# Patient Record
Sex: Male | Born: 1975 | Race: White | Hispanic: No | Marital: Married | Smoking: Former smoker
Health system: Southern US, Community
[De-identification: ages and names within clinical notes are randomized; demographics above are authoritative.]

## PROBLEM LIST (undated history)

## (undated) DIAGNOSIS — R609 Edema, unspecified: Secondary | ICD-10-CM

## (undated) DIAGNOSIS — K5792 Diverticulitis of intestine, part unspecified, without perforation or abscess without bleeding: Secondary | ICD-10-CM

## (undated) DIAGNOSIS — Z800 Family history of malignant neoplasm of digestive organs: Secondary | ICD-10-CM

## (undated) DIAGNOSIS — R7303 Prediabetes: Secondary | ICD-10-CM

## (undated) DIAGNOSIS — R600 Localized edema: Secondary | ICD-10-CM

## (undated) DIAGNOSIS — G4733 Obstructive sleep apnea (adult) (pediatric): Secondary | ICD-10-CM

## (undated) DIAGNOSIS — Z720 Tobacco use: Secondary | ICD-10-CM

## (undated) DIAGNOSIS — K219 Gastro-esophageal reflux disease without esophagitis: Secondary | ICD-10-CM

## (undated) DIAGNOSIS — I10 Essential (primary) hypertension: Secondary | ICD-10-CM

## (undated) HISTORY — DX: Prediabetes: R73.03

## (undated) HISTORY — DX: Edema, unspecified: R60.9

## (undated) HISTORY — DX: Localized edema: R60.0

## (undated) HISTORY — DX: Family history of malignant neoplasm of digestive organs: Z80.0

## (undated) HISTORY — DX: Diverticulitis of intestine, part unspecified, without perforation or abscess without bleeding: K57.92

## (undated) HISTORY — DX: Morbid (severe) obesity due to excess calories: E66.01

## (undated) HISTORY — PX: COLONOSCOPY: SHX174

## (undated) HISTORY — DX: Gastro-esophageal reflux disease without esophagitis: K21.9

## (undated) HISTORY — DX: Essential (primary) hypertension: I10

## (undated) HISTORY — DX: Tobacco use: Z72.0

---
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
## 2015-08-20 DIAGNOSIS — K5792 Diverticulitis of intestine, part unspecified, without perforation or abscess without bleeding: Secondary | ICD-10-CM

## 2015-08-20 HISTORY — DX: Diverticulitis of intestine, part unspecified, without perforation or abscess without bleeding: K57.92

## 2015-08-20 HISTORY — PX: OTHER SURGICAL HISTORY: SHX169

## 2018-09-16 ENCOUNTER — Encounter: Payer: Self-pay | Admitting: Family Medicine

## 2018-09-16 ENCOUNTER — Ambulatory Visit: Payer: Self-pay | Admitting: Family Medicine

## 2018-09-16 MED ORDER — AMOXICILLIN 875 MG PO TABS: 875.0000 mg | ORAL_TABLET | Freq: Two times a day (BID) | ORAL | 0 refills | Status: AC

## 2018-09-16 MED ORDER — PREDNISONE 20 MG PO TABS: ORAL_TABLET | ORAL | 0 refills | Status: DC

## 2018-09-16 NOTE — Patient Instructions (Signed)
Get otc generic robitussin DM OR Mucinex DM and use as directed on the packaging for cough and congestion. Use otc generic saline nasal spray 2-3 times per day to irrigate/moisturize your nasal passages.   

## 2018-09-16 NOTE — Progress Notes (Signed)
Office Note 10/19/2018  CC:  Chief Complaint  Patient presents with  . Establish Care    Previous PCP: [facility] in [place], [place]  . URI   HPI:  Hector Peters is a 43 y.o.  male who is here to establish care and discuss respiratory symptoms. Patient's most recent primary MD: see above. Old records were not available for review prior to or during today's visit.  Most recent CPE with labs was approx 2018.   He relocated here from [place] in 2018 and got a cpe just prior to coming here.  Onset 7 d/a nasal congestion, cough, feels like he is wheezing.  No HA or ST. Hoarse voice.  Dayquil/nyquil.  Sleep has been poor.  Some facial pain and upper teeth pain diffusely. No fevers.  Past Medical History:  Diagnosis Date  . Diverticulitis 2017   microperforation  . Family history of colon cancer in father   . GERD (gastroesophageal reflux disease)   . Peripheral edema    chlorthalidone for ankle swelling (per past PCP in [place]).    Past Surgical History:  Procedure Laterality Date  . COLONOSCOPY  2015; 2018   2015 screening due to +FH -->normal.  2018 Normal (done after divertic with microperf).   Recall 2021.  . microperforation  2017   Diverticulitis    Family History  Problem Relation Age of Onset  . Hypertension Mother   . Alcohol abuse Father   . Arthritis Father   . Cancer Father   . Colon cancer Father   . Testicular cancer Father   . Hypertension Father   . Hypertension Sister   . COPD Maternal Grandmother   . Heart disease Maternal Grandmother   . Heart attack Maternal Grandfather   . Heart disease Paternal Grandmother   . Lung cancer Paternal Grandfather     Social History   Socioeconomic History  . Marital status: Married    Spouse name: Not on file  . Number of children: Not on file  . Years of education: Not on file  . Highest education level: Not on file  Occupational History  . Not on file  Social Needs  . Financial resource strain:  Not on file  . Food insecurity:    Worry: Not on file    Inability: Not on file  . Transportation needs:    Medical: Not on file    Non-medical: Not on file  Tobacco Use  . Smoking status: Current Every Day Smoker    Packs/day: 1.00    Years: 20.00    Pack years: 20.00    Types: Cigarettes  . Smokeless tobacco: Never Used  Substance and Sexual Activity  . Alcohol use: Yes    Comment: occasionally  . Drug use: Never  . Sexual activity: Not on file  Lifestyle  . Physical activity:    Days per week: Not on file    Minutes per session: Not on file  . Stress: Not on file  Relationships  . Social connections:    Talks on phone: Not on file    Gets together: Not on file    Attends religious service: Not on file    Active member of club or organization: Not on file    Attends meetings of clubs or organizations: Not on file    Relationship status: Not on file  . Intimate partner violence:    Fear of current or ex partner: Not on file    Emotionally abused: Not on  file    Physically abused: Not on file    Forced sexual activity: Not on file  Other Topics Concern  . 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   Tobacco:20 pack-yr hx, active as of 09/16/2018.   Alc: social drinker.   No drugs.    Outpatient Encounter Medications as of 09/16/2018  Medication Sig  . chlorthalidone (HYGROTON) 25 MG tablet Take 25 mg by mouth daily.  . [EXPIRED] amoxicillin (AMOXIL) 875 MG tablet Take 1 tablet (875 mg total) by mouth 2 (two) times daily for 7 days.  . predniSONE (DELTASONE) 20 MG tablet 2 tabs po qd x 5d, then 1 tab po qd x 5d   No facility-administered encounter medications on file as of 09/16/2018.     No Known Allergies  ROS Review of Systems  Constitutional: Negative for fatigue and fever.  HENT: Positive for rhinorrhea, sinus pressure and sinus pain. Negative for  congestion and sore throat.   Eyes: Negative for visual disturbance.  Respiratory: Positive for cough and wheezing.   Cardiovascular: Negative for chest pain.  Gastrointestinal: Negative for abdominal pain and nausea.  Genitourinary: Negative for dysuria.  Musculoskeletal: Negative for back pain and joint swelling.  Skin: Negative for rash.  Neurological: Negative for weakness and headaches.  Hematological: Negative for adenopathy.  Psychiatric/Behavioral: Positive for sleep disturbance (wife says apneic spells chronically).    PE; Blood pressure 137/84, pulse 77, temperature (!) 97.5 F (36.4 C), temperature source Oral, resp. rate 16, height 5' 7.5" (1.715 m), weight 286 lb 2 oz (129.8 kg), SpO2 93 %. Body mass index is 44.15 kg/m.  VS: noted--normal except bp a little high. Gen: alert, NAD, NONTOXIC APPEARING. HEENT: eyes without injection, drainage, or swelling.  Ears: EACs clear, TMs with normal light reflex and landmarks.  Nose: Clear rhinorrhea, with some dried, crusty exudate adherent to mildly injected mucosa.  No purulent d/c.  No paranasal sinus TTP.  No facial swelling.  Throat and mouth without focal lesion.  No pharyngial swelling, erythema, or exudate.   Neck: supple, no LAD.   LUNGS: CTA bilat on inspiration, with some mild diffuse wheezing on exhalation.  Aeration is good, exp phase is not prolonged, nonlabored resps.   CV: RRR, no m/r/g. EXT: no c/c/e SKIN: no rash   Pertinent labs:   None today.  ASSESSMENT AND PLAN:   New pt; obtain prior PCP records.  1) Acute bronchitis with acute sinusitis: prednisone 40mg  qd x 5d, then 20mg  qd x 5d. Amoxil 875mg  bid x 7d.  2) HTN: The current medical regimen is effective;  continue present plan and medications.  When returns in 1 mo for CPE he will need referral for eval for OSA.  An After Visit Summary was printed and given to the patient.  Return in about 4 weeks (around 10/14/2018) for annual CPE  (fasting).  Signed:  Santiago Bumpers, MD           10/19/2018

## 2018-10-18 DIAGNOSIS — R7303 Prediabetes: Secondary | ICD-10-CM

## 2018-10-18 HISTORY — DX: Prediabetes: R73.03

## 2018-10-20 ENCOUNTER — Ambulatory Visit (INDEPENDENT_AMBULATORY_CARE_PROVIDER_SITE_OTHER): Payer: No Typology Code available for payment source | Admitting: Family Medicine

## 2018-10-20 ENCOUNTER — Encounter: Payer: Self-pay | Admitting: Family Medicine

## 2018-10-20 VITALS — BP 120/78 | HR 86 | Temp 97.7°F | Resp 16 | Ht 67.5 in | Wt 287.2 lb

## 2018-10-20 DIAGNOSIS — Z23 Encounter for immunization: Secondary | ICD-10-CM | POA: Diagnosis not present

## 2018-10-20 DIAGNOSIS — F17200 Nicotine dependence, unspecified, uncomplicated: Secondary | ICD-10-CM | POA: Diagnosis not present

## 2018-10-20 DIAGNOSIS — Z0000 Encounter for general adult medical examination without abnormal findings: Secondary | ICD-10-CM

## 2018-10-20 DIAGNOSIS — G4733 Obstructive sleep apnea (adult) (pediatric): Secondary | ICD-10-CM | POA: Diagnosis not present

## 2018-10-20 LAB — COMPREHENSIVE METABOLIC PANEL
ALT: 30 U/L (ref 0–53)
AST: 18 U/L (ref 0–37)
Albumin: 4.5 g/dL (ref 3.5–5.2)
Alkaline Phosphatase: 62 U/L (ref 39–117)
BUN: 19 mg/dL (ref 6–23)
CO2: 29 meq/L (ref 19–32)
Calcium: 9.7 mg/dL (ref 8.4–10.5)
Chloride: 101 mEq/L (ref 96–112)
Creatinine, Ser: 1 mg/dL (ref 0.40–1.50)
GFR: 81.8 mL/min (ref >60.00)
GLUCOSE: 114 mg/dL — AB (ref 70–99)
Potassium: 4.2 mEq/L (ref 3.5–5.1)
SODIUM: 138 meq/L (ref 135–145)
Total Bilirubin: 0.5 mg/dL (ref 0.2–1.2)
Total Protein: 6.9 g/dL (ref 6.0–8.3)

## 2018-10-20 LAB — CBC WITH DIFFERENTIAL/PLATELET
Basophils Absolute: 0.1 10*3/uL (ref 0.0–0.1)
Basophils Relative: 1.1 % (ref 0.0–3.0)
Eosinophils Absolute: 0.4 10*3/uL (ref 0.0–0.7)
Eosinophils Relative: 4.6 % (ref 0.0–5.0)
HCT: 42.9 % (ref 39.0–52.0)
Hemoglobin: 15.2 g/dL (ref 13.0–17.0)
LYMPHS ABS: 2.6 10*3/uL (ref 0.7–4.0)
Lymphocytes Relative: 32 % (ref 12.0–46.0)
MCHC: 35.5 g/dL (ref 30.0–36.0)
MCV: 95.9 fl (ref 78.0–100.0)
Monocytes Absolute: 0.5 10*3/uL (ref 0.1–1.0)
Monocytes Relative: 6.7 % (ref 3.0–12.0)
NEUTROS PCT: 55.6 % (ref 43.0–77.0)
Neutro Abs: 4.5 10*3/uL (ref 1.4–7.7)
PLATELETS: 333 10*3/uL (ref 150.0–400.0)
RBC: 4.47 Mil/uL (ref 4.22–5.81)
RDW: 14.2 % (ref 11.5–15.5)
WBC: 8 10*3/uL (ref 4.0–10.5)

## 2018-10-20 LAB — LIPID PANEL
CHOL/HDL RATIO: 5
Cholesterol: 169 mg/dL (ref 0–200)
HDL: 31.1 mg/dL — ABNORMAL LOW (ref >39.00)
LDL Cholesterol: 113 mg/dL — ABNORMAL HIGH (ref 0–99)
NonHDL: 137.98
Triglycerides: 125 mg/dL (ref 0.0–149.0)
VLDL: 25 mg/dL (ref 0.0–40.0)

## 2018-10-20 LAB — TSH: TSH: 1.27 u[IU]/mL (ref 0.35–4.50)

## 2018-10-20 NOTE — Progress Notes (Signed)
Office Note 10/20/2018  CC:  Chief Complaint  Patient presents with  . Annual Exam    pt is fasting   HPI:  Hector Peters is a 43 y.o. male who is here for annual health maintenance exam.  Still smoking, "I would love to quit".  Chantix no help in the past.   He is not interested in trying wellbutrin or nicotine replacement.  Exercise: just started getting back in gym. Diet: trying to eat 6 small meals per day, cutting back on carbs.   Has c/o snoring, wife states he goes long periods w/out breathing during sleep.  He often wakes up in the middle of sleep gasping-->"feels like my throat closes up for a minute".  Some morning HA's.  Denies excessive daytime somnolence, though.  +ED lately.  Past Medical History:  Diagnosis Date  . Diverticulitis 2017   microperforation  . Family history of colon cancer in father   . GERD (gastroesophageal reflux disease)   . Peripheral edema    chlorthalidone for ankle swelling (per past PCP in [place]).  . Tobacco abuse     Past Surgical History:  Procedure Laterality Date  . COLONOSCOPY  2015; 2018   2015 screening due to +FH -->normal.  2018 Normal (done after divertic with microperf).   Recall 2021.  . microperforation  2017   Diverticulitis    Family History  Problem Relation Age of Onset  . Hypertension Mother   . Alcohol abuse Father   . Arthritis Father   . Cancer Father   . Colon cancer Father   . Testicular cancer Father   . Hypertension Father   . Hypertension Sister   . COPD Maternal Grandmother   . Heart disease Maternal Grandmother   . Heart attack Maternal Grandfather   . Heart disease Paternal Grandmother   . Lung cancer Paternal Grandfather     Social History   Socioeconomic History  . Marital status: Married    Spouse name: Not on file  . Number of children: Not on file  . Years of education: Not on file  . Highest education level: Not on file  Occupational History  . Not on file  Social Needs  .  Financial resource strain: Not on file  . Food insecurity:    Worry: Not on file    Inability: Not on file  . Transportation needs:    Medical: Not on file    Non-medical: Not on file  Tobacco Use  . Smoking status: Current Every Day Smoker    Packs/day: 1.00    Years: 20.00    Pack years: 20.00    Types: Cigarettes  . Smokeless tobacco: Never Used  Substance and Sexual Activity  . Alcohol use: Yes    Comment: occasionally  . Drug use: Never  . Sexual activity: Not on file  Lifestyle  . Physical activity:    Days per week: Not on file    Minutes per session: Not on file  . Stress: Not on file  Relationships  . Social connections:    Talks on phone: Not on file    Gets together: Not on file    Attends religious service: Not on file    Active member of club or organization: Not on file    Attends meetings of clubs or organizations: Not on file    Relationship status: Not on file  . Intimate partner violence:    Fear of current or ex partner: Not on file  Emotionally abused: Not on file    Physically abused: Not on file    Forced sexual activity: Not on file  Other Topics Concern  . 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   Tobacco:20 pack-yr hx, active as of 09/16/2018.   Alc: social drinker.   No drugs.    Outpatient Medications Prior to Visit  Medication Sig Dispense Refill  . chlorthalidone (HYGROTON) 25 MG tablet Take 25 mg by mouth daily.    . Probiotic Product (PROBIOTIC DAILY) CAPS Take by mouth.    . predniSONE (DELTASONE) 20 MG tablet 2 tabs po qd x 5d, then 1 tab po qd x 5d (Patient not taking: Reported on 10/20/2018) 15 tablet 0   No facility-administered medications prior to visit.     No Known Allergies  ROS Review of Systems  Constitutional: Negative for appetite change, chills, fatigue and fever.  HENT: Negative for congestion, dental  problem, ear pain and sore throat.   Eyes: Negative for discharge, redness and visual disturbance.  Respiratory: Negative for cough, chest tightness, shortness of breath and wheezing.   Cardiovascular: Negative for chest pain, palpitations and leg swelling.  Gastrointestinal: Negative for abdominal pain, blood in stool, diarrhea, nausea and vomiting.  Genitourinary: Negative for difficulty urinating, dysuria, flank pain, frequency, hematuria and urgency.  Musculoskeletal: Negative for arthralgias, back pain, joint swelling, myalgias and neck stiffness.  Skin: Negative for pallor and rash.  Neurological: Negative for dizziness, speech difficulty, weakness and headaches.  Hematological: Negative for adenopathy. Does not bruise/bleed easily.  Psychiatric/Behavioral: Negative for confusion and sleep disturbance. The patient is not nervous/anxious.     PE; Blood pressure 120/78, pulse 86, temperature 97.7 F (36.5 C), temperature source Oral, resp. rate 16, height 5' 7.5" (1.715 m), weight 287 lb 3.2 oz (130.3 kg), SpO2 92 %. Body mass index is 44.32 kg/m.  Gen: Alert, well appearing, obese.  Patient is oriented to person, place, time, and situation. AFFECT: pleasant, lucid thought and speech. ENT: Ears: EACs clear, normal epithelium.  TMs with good light reflex and landmarks bilaterally.  Eyes: no injection, icteris, swelling, or exudate.  EOMI, PERRLA. Nose: no drainage or turbinate edema/swelling.  No injection or focal lesion.  Mouth: lips without lesion/swelling.  Oral mucosa pink and moist.  Dentition intact and without obvious caries or gingival swelling.  Oropharynx without erythema, exudate, or swelling. Fullness of soft palate, with narrow pharynx as a result. Neck: supple/nontender.  Fullness of neck soft tissues diffusely.  No LAD, mass, or TM.  Carotid pulses 2+ bilaterally, without bruits. CV: RRR, no m/r/g.   LUNGS: CTA bilat, nonlabored resps, good aeration in all lung  fields. ABD: soft, NT, ND, BS normal.  No hepatospenomegaly or mass.  No bruits. EXT: no clubbing, cyanosis, or edema.  Musculoskeletal: no joint swelling, erythema, warmth, or tenderness.  ROM of all joints intact. Skin - no sores or suspicious lesions or rashes or color changes   Pertinent labs:  NONE  ASSESSMENT AND PLAN:   1) OSA-->suspected.   Pt agreeable to referral to sleep MD for further evaluation.  2) Tobacco dependence: discussed quitting. He failed chantix. He doesn't want to try wellbutrin or nicotine replacement at this time-->wants to try quitting "cold [place]". Encouraged complete cessation.  3) Health maintenance exam: Reviewed age and gender appropriate health maintenance issues (prudent diet, regular exercise, health risks  of tobacco and excessive alcohol, use of seatbelts, fire alarms in home, use of sunscreen).  Also reviewed age and gender appropriate health screening as well as vaccine recommendations. Vaccines: Tdap-->given today.    Flu -->pt declined. Labs: fasting HP labs. Prostate ca screening: average risk patient= as per latest guidelines, start screening at 18 yrs of age. Colon ca screening: +FH colon ca in father-->next colonoscopy due 2021.  An After Visit Summary was printed and given to the patient.  FOLLOW UP:  Return in about 1 year (around 10/20/2019) for annual CPE (fasting).  Signed:  Santiago Bumpers, MD           10/20/2018

## 2018-10-20 NOTE — Addendum Note (Signed)
Addended by: Lenis Dickinson on: 10/20/2018 09:31 AM   Modules accepted: Orders

## 2018-10-20 NOTE — Patient Instructions (Signed)

## 2018-10-21 ENCOUNTER — Other Ambulatory Visit: Payer: Self-pay | Admitting: *Deleted

## 2018-10-21 ENCOUNTER — Encounter: Payer: Self-pay | Admitting: Family Medicine

## 2018-10-21 ENCOUNTER — Other Ambulatory Visit (INDEPENDENT_AMBULATORY_CARE_PROVIDER_SITE_OTHER): Payer: No Typology Code available for payment source

## 2018-10-21 ENCOUNTER — Other Ambulatory Visit: Payer: Self-pay | Admitting: Family Medicine

## 2018-10-21 DIAGNOSIS — R7301 Impaired fasting glucose: Secondary | ICD-10-CM

## 2018-10-21 DIAGNOSIS — R7303 Prediabetes: Secondary | ICD-10-CM

## 2018-10-21 LAB — HEMOGLOBIN A1C: Hgb A1c MFr Bld: 6.2 % (ref 4.6–6.5)

## 2018-10-23 ENCOUNTER — Ambulatory Visit: Payer: Self-pay | Admitting: Family Medicine

## 2018-10-23 NOTE — Telephone Encounter (Signed)
Pt called in and was given lab results from Dr. Milinda Cave dated 10/21/2018 at 5:01 PM.  He verbalized understanding of the diet instructions to lower his glucose and HbA1C.  No questions.  I scheduled him for a lab only appt for 03/25/2019 at 8:00.   I instructed him to come in fasting.

## 2019-02-12 ENCOUNTER — Other Ambulatory Visit: Payer: Self-pay

## 2019-02-12 ENCOUNTER — Ambulatory Visit: Payer: 59 | Admitting: Internal Medicine

## 2019-02-12 ENCOUNTER — Encounter: Payer: Self-pay | Admitting: Internal Medicine

## 2019-02-12 VITALS — BP 130/80 | HR 80 | Temp 97.9°F | Ht 67.0 in | Wt 288.6 lb

## 2019-02-12 DIAGNOSIS — G4733 Obstructive sleep apnea (adult) (pediatric): Secondary | ICD-10-CM | POA: Diagnosis not present

## 2019-02-12 DIAGNOSIS — Z720 Tobacco use: Secondary | ICD-10-CM | POA: Diagnosis not present

## 2019-02-12 DIAGNOSIS — R0683 Snoring: Secondary | ICD-10-CM | POA: Diagnosis not present

## 2019-02-12 NOTE — Progress Notes (Signed)
02/12/2019- 42 yoM current Smoker for sleep evaluation. referred by Dr. Milinda CaveMcGowen (PCP) for OSA evaluation, pt reports unrestfull sleep, snoring, and choking in the middle of the night. Daytime sleepiness. Body weight today 288 lbs Epworth score 4 No sleep med, 2-3 cups coffee daily. Denies ENT surgery, lung/ heart probs. Several family members with OSA on CPAP.   Prior to Admission medications   Medication Sig Start Date End Date Taking? Authorizing Provider  chlorthalidone (HYGROTON) 25 MG tablet Take 25 mg by mouth daily.   Yes [provider]  Probiotic Product (PROBIOTIC DAILY) CAPS Take by mouth.   Yes [provider]   Past Medical History:  Diagnosis Date  . Diverticulitis 2017   microperforation  . Family history of colon cancer in father   . GERD (gastroesophageal reflux disease)   . Peripheral edema    chlorthalidone for ankle swelling (per past PCP in [place]).  . Prediabetes 10/2018   fasting gluc 114, hbA1c 6.2%.  . Tobacco abuse    Past Surgical History:  Procedure Laterality Date  . COLONOSCOPY  2015; 2018   2015 screening due to +FH -->normal.  2018 Normal (done after divertic with microperf).   Recall 2021.  . microperforation  2017   Diverticulitis   Family History  Problem Relation Age of Onset  . Hypertension Mother   . Alcohol abuse Father   . Arthritis Father   . Cancer Father   . Colon cancer Father   . Testicular cancer Father   . Hypertension Father   . Hypertension Sister   . COPD Maternal Grandmother   . Heart disease Maternal Grandmother   . Heart attack Maternal Grandfather   . Heart disease Paternal Grandmother   . Lung cancer Paternal Grandfather    Social History   Socioeconomic History  . Marital status: Married    Spouse name: Not on file  . Number of children: Not on file  . Years of education: Not on file  . Highest education level: Not on file  Occupational History  . Not on file  Social Needs  . Financial  resource strain: Not on file  . Food insecurity    Worry: Not on file    Inability: Not on file  . Transportation needs    Medical: Not on file    Non-medical: Not on file  Tobacco Use  . Smoking status: Current Every Day Smoker    Packs/day: 1.00    Years: 20.00    Pack years: 20.00    Types: Cigarettes  . Smokeless tobacco: Never Used  Substance and Sexual Activity  . Alcohol use: Yes    Comment: occasionally  . Drug use: Never  . Sexual activity: Not on file  Lifestyle  . Physical activity    Days per week: Not on file    Minutes per session: Not on file  . Stress: Not on file  Relationships  . Social Musicianconnections    Talks on phone: Not on file    Gets together: Not on file    Attends religious service: Not on file    Active member of club or organization: Not on file    Attends meetings of clubs or organizations: Not on file    Relationship status: Not on file  . Intimate partner violence    Fear of current or ex partner: Not on file    Emotionally abused: Not on file    Physically abused: Not on file    Forced  sexual activity: Not on file  Other Topics Concern  . 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   Tobacco:20 pack-yr hx, active as of 09/16/2018.   Alc: social drinker.   No drugs.   ROS-see HPI  + = positive Constitutional:    weight loss, night sweats, fevers, chills, fatigue, lassitude. HEENT:    headaches, difficulty swallowing, +tooth/dental problems, sore throat,       sneezing, itching, ear ache, nasal congestion, post nasal drip, snoring CV:    chest pain, orthopnea, PND, swelling in lower extremities, anasarca,                                  dizziness, palpitations Resp:   +shortness of breath with exertion or at rest.                productive cough,   non-productive cough, coughing up of blood.              change in color of mucus.   wheezing.   Skin:    rash or lesions. GI:  + heartburn, indigestion, abdominal pain, nausea, vomiting, diarrhea,                 change in bowel habits, loss of appetite GU: dysuria, change in color of urine, no urgency or frequency.   flank pain. MS:   joint pain, stiffness, decreased range of motion, back pain. Neuro-     nothing unusual Psych:  change in mood or affect.  depression or anxiety.   memory loss.  OBJ- Physical Exam General- Alert, Oriented, Affect-appropriate, Distress- none acute, + obese Skin- rash-none, lesions- none, excoriation- none Lymphadenopathy- none Head- atraumatic            Eyes- Gross vision intact, PERRLA, conjunctivae and secretions clear            Ears- Hearing, canals-normal            Nose- Clear, no-Septal dev, mucus, polyps, erosion, perforation             Throat- Mallampati III , mucosa clear , drainage- none, tonsils- atrophic, + teeth Neck- flexible , trachea midline, no stridor , thyroid nl, carotid no bruit Chest - symmetrical excursion , unlabored           Heart/CV- RRR , no murmur , no gallop  , no rub, nl s1 s2                           - JVD- none , edema- none, stasis changes- none, varices- none           Lung- clear to P&A, wheeze- none, cough- none , dullness-none, rub- none           Chest wall-  Abd-  Br/ Gen/ Rectal- Not done, not indicated Extrem- cyanosis- none, clubbing, none, atrophy- none, strength- nl Neuro- grossly intact to observation

## 2019-02-12 NOTE — Assessment & Plan Note (Signed)
High probability for significant obstructive sleep apnea. Appropriate education and discussion about sleep hygiene, OSA, treatment, importance of weight and driving responsibility. Plan- sleep study, anticipate CPAP

## 2019-02-12 NOTE — Assessment & Plan Note (Signed)
Reports 1 ppd, denies lung symptoms now other than DOE which is nonspecific and may reflect his weight.  Plan- strongly encourage real effort to stop.

## 2019-02-12 NOTE — Patient Instructions (Signed)
Order- please schedule unattended home sleep test    Dx OSA  Please call me about 2 weeks after your sleep test to see if results and recommendations are ready yet. If appropriate, we may be able to start treatment before we see you next.

## 2019-02-17 DIAGNOSIS — G4733 Obstructive sleep apnea (adult) (pediatric): Secondary | ICD-10-CM

## 2019-02-17 HISTORY — PX: POLYSOMNOGRAPHY: SHX453

## 2019-02-17 HISTORY — DX: Obstructive sleep apnea (adult) (pediatric): G47.33

## 2019-03-01 ENCOUNTER — Telehealth: Payer: Self-pay | Admitting: Internal Medicine

## 2019-03-01 NOTE — Telephone Encounter (Signed)
Patient has been scheduled to pick up HST machine on 03/03/19 @ 9:00am and he is aware of the appt

## 2019-03-03 ENCOUNTER — Ambulatory Visit: Payer: 59

## 2019-03-03 ENCOUNTER — Other Ambulatory Visit: Payer: Self-pay

## 2019-03-03 DIAGNOSIS — G4733 Obstructive sleep apnea (adult) (pediatric): Secondary | ICD-10-CM | POA: Diagnosis not present

## 2019-03-08 DIAGNOSIS — G4733 Obstructive sleep apnea (adult) (pediatric): Secondary | ICD-10-CM | POA: Diagnosis not present

## 2019-03-23 ENCOUNTER — Encounter: Payer: Self-pay | Admitting: Family Medicine

## 2019-03-25 ENCOUNTER — Telehealth: Payer: Self-pay | Admitting: Internal Medicine

## 2019-03-25 ENCOUNTER — Ambulatory Visit: Payer: 59

## 2019-03-25 DIAGNOSIS — G4733 Obstructive sleep apnea (adult) (pediatric): Secondary | ICD-10-CM

## 2019-03-25 NOTE — Telephone Encounter (Signed)
CY, Mr. Hector Peters is requesting the results for his home sleep study performed on 03/03/2019. Please advise with your results and recommendations. Thank you.

## 2019-03-25 NOTE — Telephone Encounter (Signed)
His sleep study showed severe obstructive sleep apnea,averaging over 86 apneas/ hour, with drops in blood oxygen level.  I recommend we order new DME, new OSA, auto 5-20, mask of choice, humidifier, supplies, AirView/ card  Please make sure he has a return ov in 31-90 days per insurance regs

## 2019-03-25 NOTE — Telephone Encounter (Signed)
Called and spoke w/ pt regarding CY's results and recommendations. Pt verbalized understanding and agreed to these measures. Order for new DME, new CPAP start has been placed per CY. Nothing further needed at this time.

## 2019-03-29 ENCOUNTER — Telehealth: Payer: Self-pay | Admitting: Internal Medicine

## 2019-03-30 NOTE — Telephone Encounter (Signed)
Called and spoke with pt letting him know the info stated by [facility]. Pt verbalized understanding. I provided pt the phone number for [facility] so he could call them to get an update. Nothing further needed.

## 2019-03-30 NOTE — Telephone Encounter (Signed)
Order was placed on 03/25/2019 for CPAP.  [facility] - could you help follow up on this? Thanks.

## 2019-03-30 NOTE — Telephone Encounter (Signed)
This order was just sent last week on 03/25/19 it usually take 1-2 wks for the dem to call him he can call [facility] if he wants [phone] thanks

## 2019-04-01 ENCOUNTER — Ambulatory Visit (INDEPENDENT_AMBULATORY_CARE_PROVIDER_SITE_OTHER): Payer: 59

## 2019-04-01 ENCOUNTER — Other Ambulatory Visit: Payer: Self-pay

## 2019-04-01 DIAGNOSIS — R7303 Prediabetes: Secondary | ICD-10-CM

## 2019-04-01 LAB — HEMOGLOBIN A1C: Hgb A1c MFr Bld: 6.2 % (ref 4.6–6.5)

## 2019-04-01 LAB — GLUCOSE, RANDOM: Glucose, Bld: 111 mg/dL — ABNORMAL HIGH (ref 70–99)

## 2019-04-02 ENCOUNTER — Ambulatory Visit: Payer: 59 | Admitting: Podiatry

## 2019-04-02 ENCOUNTER — Encounter: Payer: Self-pay | Admitting: Family Medicine

## 2019-04-02 VITALS — BP 139/87 | HR 85 | Temp 98.2°F | Resp 16

## 2019-04-02 DIAGNOSIS — L600 Ingrowing nail: Secondary | ICD-10-CM | POA: Diagnosis not present

## 2019-04-02 MED ORDER — NEOMYCIN-POLYMYXIN-HC 3.5-10000-1 OT SOLN: OTIC | 1 refills | Status: DC

## 2019-04-02 NOTE — Progress Notes (Signed)
   Subjective:    Patient ID: Hector Peters, male    DOB: 1975-11-03, 43 y.o.   MRN: [id number]  HPI    Review of Systems  All other systems reviewed and are negative.      Objective:   Physical Exam        Assessment & Plan:

## 2019-04-02 NOTE — Progress Notes (Signed)
Subjective:   Patient ID: Hector Peters, male   DOB: 43 y.o.   MRN: [id number]   HPI Patient presents stating that he has had a painful ingrown toenail of his left big toe and has a long-term history of these.  States the recent episode is been for several weeks and he is tried to soak it in treatment without relief and states the right one bothers him but not to the same degree.  Patient smokes 1 pack/day and would like to be active and is obese   Review of Systems  All other systems reviewed and are negative.       Objective:  Physical Exam Vitals signs and nursing note reviewed.  Constitutional:      Appearance: He is well-developed.  Pulmonary:     Effort: Pulmonary effort is normal.  Musculoskeletal: Normal range of motion.  Skin:    General: Skin is warm.  Neurological:     Mental Status: He is alert.     Neurovascular status intact muscle strength found to be adequate range of motion within normal limits.  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  Explained procedure risk and patient wants surgery and signed consent form and today I infiltrated the left hallux 60 mg Xylocaine Marcaine mixture sterile prep applied to the toe using sterile instrumentation remove medial border exposed matrix and applied phenol 3 applications 30 seconds followed by alcohol lavage and sterile dressing.  Gave instructions on soaks and drops and leave dressing on 24 hours but take it off earlier if throbbing were to occur and encouraged patient to call with any questions concerns which may arise

## 2019-04-02 NOTE — Patient Instructions (Signed)

## 2019-06-11 ENCOUNTER — Ambulatory Visit: Payer: 59 | Admitting: Internal Medicine

## 2019-06-11 ENCOUNTER — Encounter: Payer: Self-pay | Admitting: Internal Medicine

## 2019-06-11 DIAGNOSIS — Z720 Tobacco use: Secondary | ICD-10-CM | POA: Diagnosis not present

## 2019-06-11 DIAGNOSIS — G4733 Obstructive sleep apnea (adult) (pediatric): Secondary | ICD-10-CM | POA: Diagnosis not present

## 2019-06-11 NOTE — Progress Notes (Signed)
HPI M smoker followed for OSA, complicated by morbid obesity, tobacco use, GERD,  HST 03/03/2019- AHI 86.4/ hr, desaturation to 76% w average 89%, body weight 288.9 lbs  -------------------------------------------------------------------------------------  02/12/2019- 46 yoM current Smoker for sleep evaluation. referred by Dr. Anitra Lauth (PCP) for OSA evaluation, pt reports unrestfull sleep, snoring, and choking in the middle of the night. Daytime sleepiness. Body weight today 288 lbs Epworth score 4 No sleep med, 2-3 cups coffee daily. Denies ENT surgery, lung/ heart probs. Several family members with OSA on CPAP.   06/11/2019- 42 yoM smoker followed for OSA, complicated by morbid obesity, tobacco use, GERD,  HST 03/03/2019- AHI 86.4/ hr, desaturation to 76% w average 89%, body weight 288.9 lbs CPAP auto 5-20/ [facility] -----OSA on CPAP, DME: [facility]; no complaints, states he does not get up in the middle of the night anymore Body weight today 292 lbs His Phone App-- compliance 90%, AHI 0.6/ hr, averaging 4.57 hrs use/ night. He says he never sleeps much over 5 hours. With CPAP he sleeps through the night and no longer snores.  Has download card but didn't bring it.  Declines flu vax.  ROS-see HPI  + = positive Constitutional:    weight loss, night sweats, fevers, chills, fatigue, lassitude. HEENT:    headaches, difficulty swallowing, +tooth/dental problems, sore throat,       sneezing, itching, ear ache, nasal congestion, post nasal drip, snoring CV:    chest pain, orthopnea, PND, swelling in lower extremities, anasarca,                                  dizziness, palpitations Resp:   +shortness of breath with exertion or at rest.                productive cough,   non-productive cough, coughing up of blood.              change in color of mucus.  wheezing.   Skin:    rash or lesions. GI:  + heartburn, indigestion, abdominal pain, nausea, vomiting, diarrhea,             change in bowel habits, loss of appetite GU: dysuria, change in color of urine, no urgency or frequency.   flank pain. MS:   joint pain, stiffness, decreased range of motion, back pain. Neuro-     nothing unusual Psych:  change in mood or affect.  depression or anxiety.   memory loss.  OBJ- Physical Exam General- Alert, Oriented, Affect-appropriate, Distress- none acute, + obese Skin- rash-none, lesions- none, excoriation- none Lymphadenopathy- none Head- atraumatic            Eyes- Gross vision intact, PERRLA, conjunctivae and secretions clear            Ears- Hearing, canals-normal            Nose- Clear, no-Septal dev, mucus, polyps, erosion, perforation             Throat- Mallampati III , mucosa clear , drainage- none, tonsils- atrophic, + teeth Neck- flexible , trachea midline, no stridor , thyroid nl, carotid no bruit Chest - symmetrical excursion , unlabored           Heart/CV- RRR , no murmur , no gallop  , no rub, nl s1 s2                           -  JVD- none , edema- none, stasis changes- none, varices- none           Lung- clear to P&A, wheeze- none, cough- none , dullness-none, rub- none           Chest wall-  Abd-  Br/ Gen/ Rectal- Not done, not indicated Extrem- cyanosis- none, clubbing, none, atrophy- none, strength- nl Neuro- grossly intact to observation

## 2019-06-11 NOTE — Assessment & Plan Note (Signed)
Strongly encourage smoking cessation.

## 2019-06-11 NOTE — Assessment & Plan Note (Signed)
His weight represents significant additional morbidity. Meaningful effort at weight los strongly encouraged.

## 2019-06-11 NOTE — Assessment & Plan Note (Signed)
Benefits from CPAP already being recognized. He is comfortable. Discussed download information. Plan- continue CPAP auto 5-20

## 2019-06-11 NOTE — Patient Instructions (Signed)
We can continue CPAP auto 5-20, mask of choice, humidifier, supplies, AirView/ card  Please bring your download card when you come for return visits.  Please call if we can help

## 2019-06-13 ENCOUNTER — Encounter: Payer: Self-pay | Admitting: Family Medicine

## 2019-10-13 ENCOUNTER — Ambulatory Visit: Payer: 59 | Admitting: Internal Medicine

## 2020-06-01 ENCOUNTER — Ambulatory Visit: Payer: 59 | Admitting: Podiatry

## 2020-06-01 ENCOUNTER — Encounter: Payer: Self-pay | Admitting: Podiatry

## 2020-06-01 ENCOUNTER — Other Ambulatory Visit: Payer: Self-pay

## 2020-06-01 DIAGNOSIS — L600 Ingrowing nail: Secondary | ICD-10-CM

## 2020-06-01 MED ORDER — NEOMYCIN-POLYMYXIN-HC 3.5-10000-1 OT SOLN: OTIC | 1 refills | Status: DC

## 2020-06-01 NOTE — Patient Instructions (Signed)
Place 1/4 cup of epsom salts in a quart of warm tap water.  Submerge your foot or feet in the solution and soak for 20 minutes.  This soak should be done twice a day.  Next, remove your foot or feet from solution, blot dry the affected area. Apply ointment and cover if instructed by your doctor.   IF YOUR SKIN BECOMES IRRITATED WHILE USING THESE INSTRUCTIONS, IT IS OKAY TO SWITCH TO  WHITE VINEGAR AND WATER.  As another alternative soak, you may use antibacterial soap and water.  Monitor for any signs/symptoms of infection. Call the office immediately if any occur or go directly to the emergency room. Call with any questions/concerns.  Ingrown Toenail An ingrown toenail occurs when the corner or sides of a toenail grow into the surrounding skin. This causes discomfort and pain. The big toe is most commonly affected, but any of the toes can be affected. If an ingrown toenail is not treated, it can become infected. What are the causes? This condition may be caused by:  Wearing shoes that are too small or tight.  An injury, such as stubbing your toe or having your toe stepped on.  Improper cutting or care of your toenails.  Having nail or foot abnormalities that were present from birth (congenital abnormalities), such as having a nail that is too big for your toe. What increases the risk? The following factors may make you more likely to develop ingrown toenails:  Age. Nails tend to get thicker with age, so ingrown nails are more common among older people.  Cutting your toenails incorrectly, such as cutting them very short or cutting them unevenly. An ingrown toenail is more likely to get infected if you have:  Diabetes.  Blood flow (circulation) problems. What are the signs or symptoms? Symptoms of an ingrown toenail may include:  Pain, soreness, or tenderness.  Redness.  Swelling.  Hardening of the skin that surrounds the toenail. Signs that an ingrown toenail may be infected  include:  Fluid or pus.  Symptoms that get worse instead of better. How is this diagnosed? An ingrown toenail may be diagnosed based on your medical history, your symptoms, and a physical exam. If you have fluid or blood coming from your toenail, a sample may be collected to test for the specific type of bacteria that is causing the infection. How is this treated? Treatment depends on how severe your ingrown toenail is. You may be able to care for your toenail at home.  If you have an infection, you may be prescribed antibiotic medicines.  If you have fluid or pus draining from your toenail, your health care provider may drain it.  If you have trouble walking, you may be given crutches to use.  If you have a severe or infected ingrown toenail, you may need a procedure to remove part or all of the nail. Follow these instructions at home: Foot care   Do not pick at your toenail or try to remove it yourself.  Soak your foot in warm, soapy water. Do this for 20 minutes, 3 times a day, or as often as told by your health care provider. This helps to keep your toe clean and keep your skin soft.  Wear shoes that fit well and are not too tight. Your health care provider may recommend that you wear open-toed shoes while you heal.  Trim your toenails regularly and carefully. Cut your toenails straight across to prevent injury to the skin at the   corners of the toenail. Do not cut your nails in a curved shape.  Keep your feet clean and dry to help prevent infection. Medicines  Take over-the-counter and prescription medicines only as told by your health care provider.  If you were prescribed an antibiotic, take it as told by your health care provider. Do not stop taking the antibiotic even if you start to feel better. Activity  Return to your normal activities as told by your health care provider. Ask your health care provider what activities are safe for you.  Avoid activities that cause  pain. General instructions  If your health care provider told you to use crutches to help you move around, use them as instructed.  Keep all follow-up visits as told by your health care provider. This is important. Contact a health care provider if:  You have more redness, swelling, pain, or other symptoms that do not improve with treatment.  You have fluid, blood, or pus coming from your toenail. Get help right away if:  You have a red streak on your skin that starts at your foot and spreads up your leg.  You have a fever. Summary  An ingrown toenail occurs when the corner or sides of a toenail grow into the surrounding skin. This causes discomfort and pain. The big toe is most commonly affected, but any of the toes can be affected.  If an ingrown toenail is not treated, it can become infected.  Fluid or pus draining from your toenail is a sign of infection. Your health care provider may need to drain it. You may be given antibiotics to treat the infection.  Trimming your toenails regularly and properly can help you prevent an ingrown toenail. This information is not intended to replace advice given to you by your health care provider. Make sure you discuss any questions you have with your health care provider. Document Revised: 11/27/2018 Document Reviewed: 04/23/2017 Elsevier Patient Education  2020 Elsevier Inc.  

## 2020-06-01 NOTE — Progress Notes (Signed)
Subjective:   Patient ID: Hector Peters, male   DOB: 44 y.o.   MRN: [id number]   HPI Patient states his left big toenail has been very tender and it is more within the entire nail bed itself   ROS      Objective:  Physical Exam  Neuro vascular status intact with a thickened deformed left hallux nail that is painful when palpated across     Assessment:  Nail disease left big toe nail with pain     Plan:  H&P reviewed condition recommended nail removal.  Patient wants surgery and I explained procedure risk and patient is willing to accept risk signed consent form.  I infiltrated the left hallux 60 mg like Marcaine mixture sterile prep done using sterile instrumentation removed the hallux nail exposed matrix applied phenol for applications 30 seconds followed by alcohol lavage and sterile dressing.  Gave instructions on soaks and to leave dressing on 24 hours but take it off earlier if needed and also wrote for drops and encouraged to call with questions concerns

## 2020-11-21 DIAGNOSIS — D2261 Melanocytic nevi of right upper limb, including shoulder: Secondary | ICD-10-CM | POA: Diagnosis not present

## 2020-11-21 DIAGNOSIS — D1801 Hemangioma of skin and subcutaneous tissue: Secondary | ICD-10-CM | POA: Diagnosis not present

## 2020-11-21 DIAGNOSIS — D2271 Melanocytic nevi of right lower limb, including hip: Secondary | ICD-10-CM | POA: Diagnosis not present

## 2020-11-21 DIAGNOSIS — L918 Other hypertrophic disorders of the skin: Secondary | ICD-10-CM | POA: Diagnosis not present

## 2020-11-21 DIAGNOSIS — D225 Melanocytic nevi of trunk: Secondary | ICD-10-CM | POA: Diagnosis not present

## 2021-01-01 DIAGNOSIS — I10 Essential (primary) hypertension: Secondary | ICD-10-CM | POA: Diagnosis not present

## 2021-01-02 ENCOUNTER — Ambulatory Visit
Admission: RE | Admit: 2021-01-02 | Discharge: 2021-01-02 | Disposition: A | Discharge status: Home / Self Care | Payer: BLUE CROSS/BLUE SHIELD | Source: Ambulatory Visit | Attending: Family Medicine | Admitting: Family Medicine

## 2021-01-02 ENCOUNTER — Other Ambulatory Visit: Payer: Self-pay | Admitting: Family Medicine

## 2021-01-02 ENCOUNTER — Other Ambulatory Visit: Payer: Self-pay

## 2021-01-02 DIAGNOSIS — M79661 Pain in right lower leg: Secondary | ICD-10-CM

## 2021-01-02 DIAGNOSIS — M79604 Pain in right leg: Secondary | ICD-10-CM | POA: Diagnosis not present

## 2021-01-09 DIAGNOSIS — I10 Essential (primary) hypertension: Secondary | ICD-10-CM | POA: Diagnosis not present

## 2021-01-10 DIAGNOSIS — M79604 Pain in right leg: Secondary | ICD-10-CM | POA: Diagnosis not present

## 2021-03-13 DIAGNOSIS — I10 Essential (primary) hypertension: Secondary | ICD-10-CM | POA: Diagnosis not present

## 2021-03-13 DIAGNOSIS — R7303 Prediabetes: Secondary | ICD-10-CM | POA: Diagnosis not present

## 2021-05-25 IMAGING — CR DG TIBIA/FIBULA 2V*R*
4 series · 4 of 4 positions shown · non-contrast
Comparison: None.

CLINICAL DATA: Right leg pain.  No known injury

EXAM:
RIGHT TIBIA AND FIBULA - 2 VIEW

[t tib-fib ap right (1 of 2)]
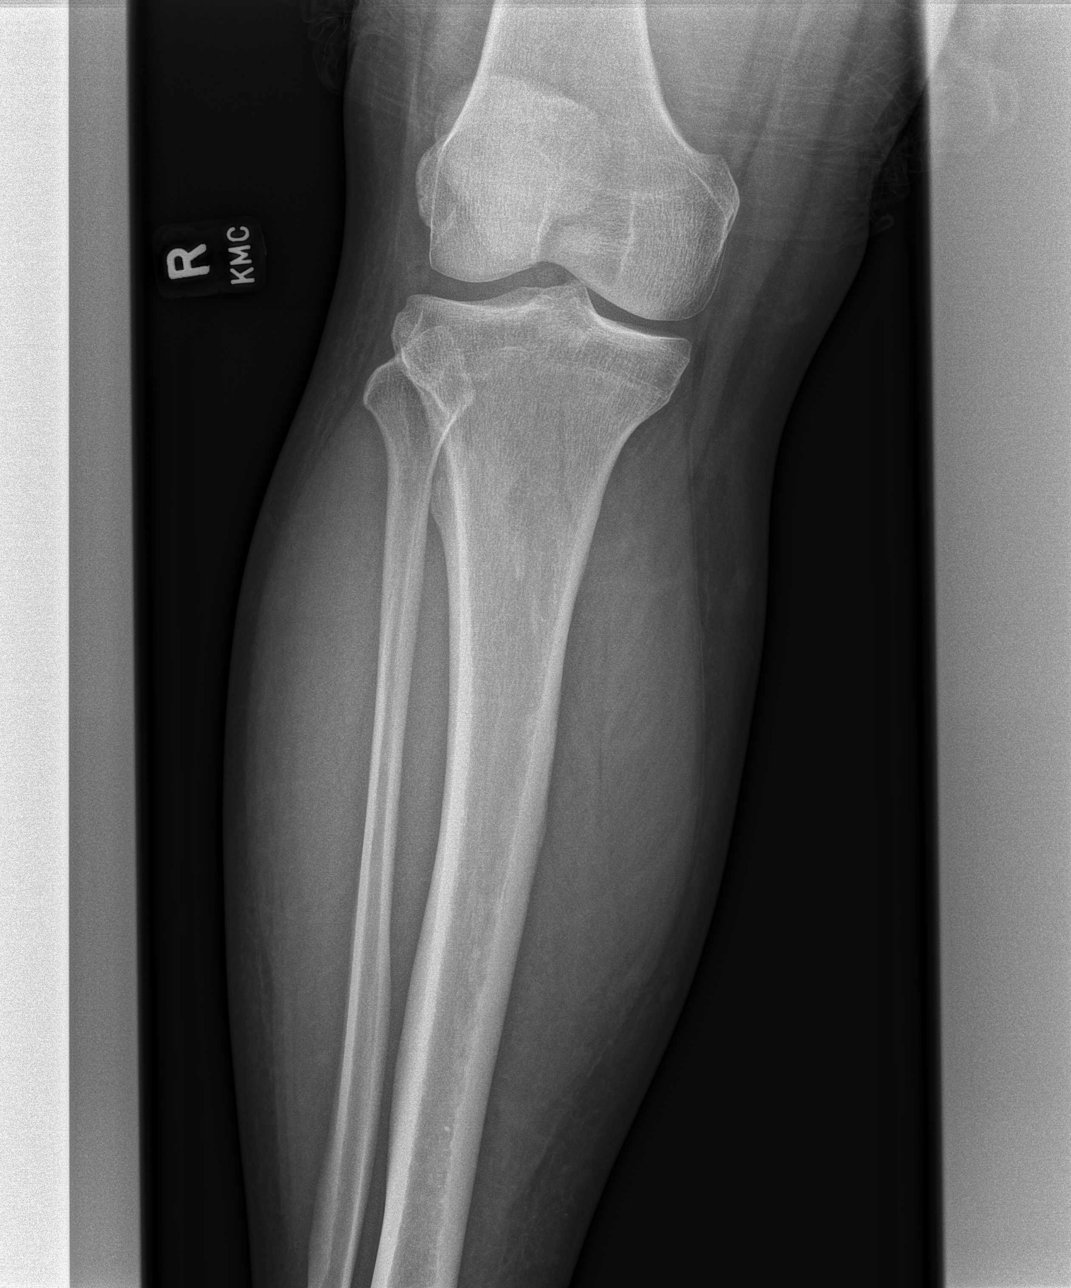

[t tib-fib ap right (2 of 2)]
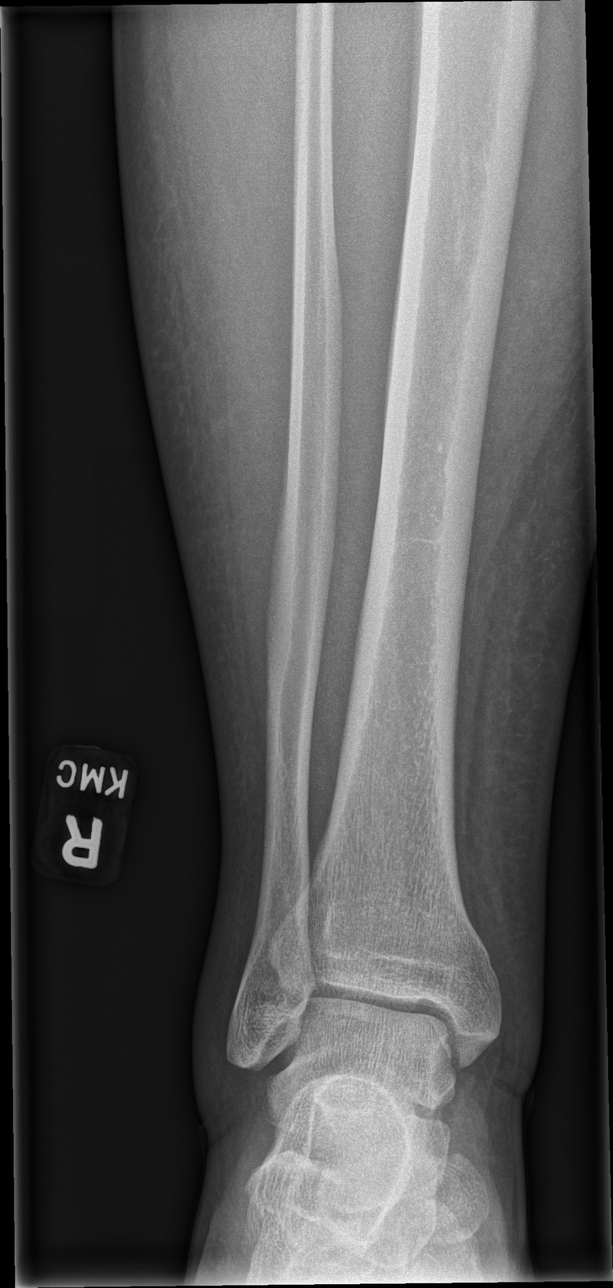

[t tib-fib lat right (1 of 2)]
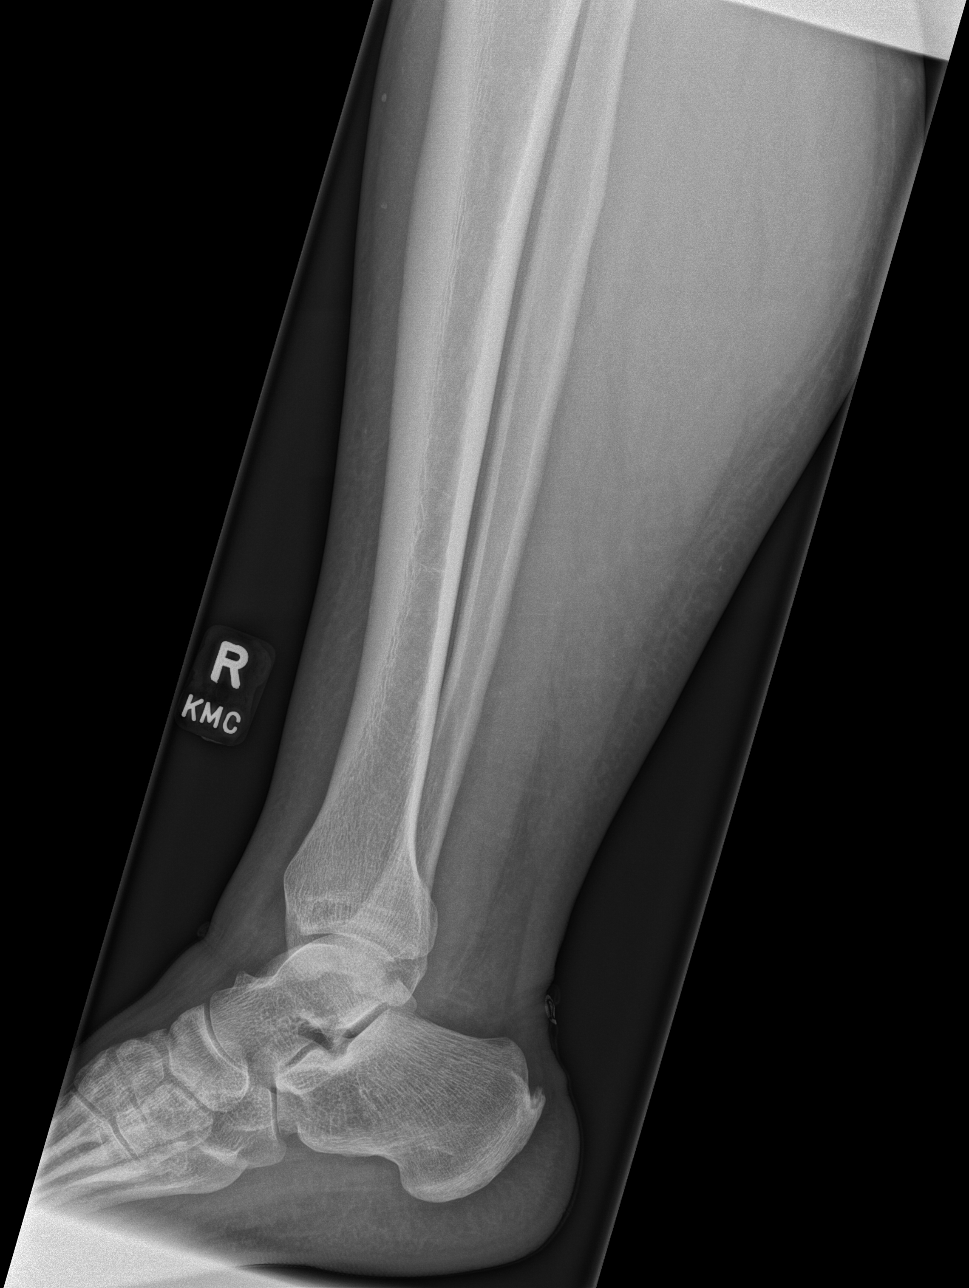

[t tib-fib lat right (2 of 2)]
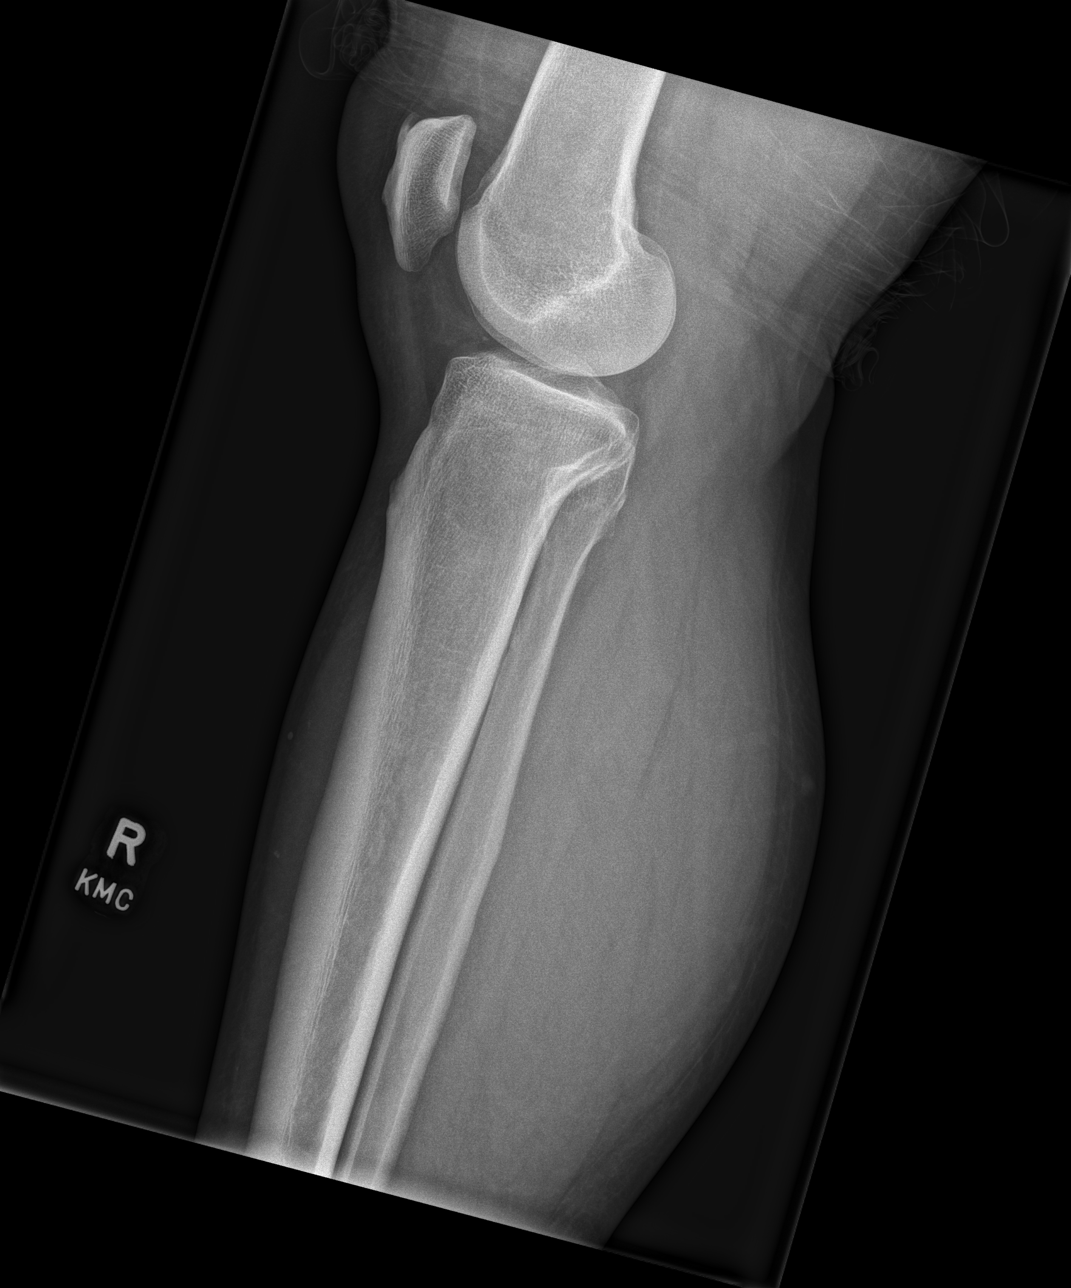

[4 of 4 positions shown; findings below may reference images not displayed]

FINDINGS: There is no evidence of fracture or other focal bone lesions. Soft
tissues are unremarkable. Calcaneal spurring at the Achilles tendon
insertion.
IMPRESSION: Negative.

## 2021-06-13 DIAGNOSIS — I10 Essential (primary) hypertension: Secondary | ICD-10-CM | POA: Diagnosis not present

## 2021-06-13 DIAGNOSIS — R7303 Prediabetes: Secondary | ICD-10-CM | POA: Diagnosis not present

## 2021-07-17 DIAGNOSIS — R3129 Other microscopic hematuria: Secondary | ICD-10-CM | POA: Diagnosis not present

## 2021-07-27 DIAGNOSIS — K5792 Diverticulitis of intestine, part unspecified, without perforation or abscess without bleeding: Secondary | ICD-10-CM | POA: Diagnosis not present

## 2021-08-17 DIAGNOSIS — M25511 Pain in right shoulder: Secondary | ICD-10-CM | POA: Diagnosis not present

## 2021-09-11 DIAGNOSIS — Z87442 Personal history of urinary calculi: Secondary | ICD-10-CM | POA: Diagnosis not present

## 2021-09-11 DIAGNOSIS — R3121 Asymptomatic microscopic hematuria: Secondary | ICD-10-CM | POA: Diagnosis not present

## 2021-09-11 DIAGNOSIS — N281 Cyst of kidney, acquired: Secondary | ICD-10-CM | POA: Diagnosis not present

## 2021-09-11 DIAGNOSIS — Z125 Encounter for screening for malignant neoplasm of prostate: Secondary | ICD-10-CM | POA: Diagnosis not present

## 2021-09-18 DIAGNOSIS — R3121 Asymptomatic microscopic hematuria: Secondary | ICD-10-CM | POA: Diagnosis not present

## 2021-10-09 DIAGNOSIS — R3121 Asymptomatic microscopic hematuria: Secondary | ICD-10-CM | POA: Diagnosis not present

## 2021-10-09 DIAGNOSIS — N281 Cyst of kidney, acquired: Secondary | ICD-10-CM | POA: Diagnosis not present

## 2021-11-13 DIAGNOSIS — R7303 Prediabetes: Secondary | ICD-10-CM | POA: Diagnosis not present

## 2021-11-13 DIAGNOSIS — Z0000 Encounter for general adult medical examination without abnormal findings: Secondary | ICD-10-CM | POA: Diagnosis not present

## 2021-11-13 DIAGNOSIS — E7800 Pure hypercholesterolemia, unspecified: Secondary | ICD-10-CM | POA: Diagnosis not present

## 2021-11-13 DIAGNOSIS — I10 Essential (primary) hypertension: Secondary | ICD-10-CM | POA: Diagnosis not present

## 2021-12-14 ENCOUNTER — Other Ambulatory Visit: Payer: Self-pay | Admitting: Urology

## 2021-12-14 DIAGNOSIS — N281 Cyst of kidney, acquired: Secondary | ICD-10-CM

## 2022-01-09 ENCOUNTER — Ambulatory Visit
Admission: RE | Admit: 2022-01-09 | Discharge: 2022-01-09 | Disposition: A | Discharge status: Home / Self Care | Payer: BLUE CROSS/BLUE SHIELD | Source: Ambulatory Visit | Attending: Urology | Admitting: Urology

## 2022-01-09 DIAGNOSIS — N281 Cyst of kidney, acquired: Secondary | ICD-10-CM

## 2022-01-09 MED ORDER — GADOBENATE DIMEGLUMINE 529 MG/ML IV SOLN
20.0000 mL | Freq: Once | INTRAVENOUS | Status: AC | PRN
  Administered 2022-01-09: 20 mL via INTRAVENOUS

## 2022-01-16 DIAGNOSIS — R3121 Asymptomatic microscopic hematuria: Secondary | ICD-10-CM | POA: Diagnosis not present

## 2022-01-16 DIAGNOSIS — Z125 Encounter for screening for malignant neoplasm of prostate: Secondary | ICD-10-CM | POA: Diagnosis not present

## 2022-01-16 DIAGNOSIS — N281 Cyst of kidney, acquired: Secondary | ICD-10-CM | POA: Diagnosis not present

## 2022-01-28 DIAGNOSIS — H1031 Unspecified acute conjunctivitis, right eye: Secondary | ICD-10-CM | POA: Diagnosis not present

## 2022-01-28 DIAGNOSIS — I10 Essential (primary) hypertension: Secondary | ICD-10-CM | POA: Diagnosis not present

## 2022-02-13 DIAGNOSIS — R002 Palpitations: Secondary | ICD-10-CM | POA: Diagnosis not present

## 2022-02-13 DIAGNOSIS — R5383 Other fatigue: Secondary | ICD-10-CM | POA: Diagnosis not present

## 2022-02-13 DIAGNOSIS — R0602 Shortness of breath: Secondary | ICD-10-CM | POA: Diagnosis not present

## 2022-02-13 DIAGNOSIS — I444 Left anterior fascicular block: Secondary | ICD-10-CM | POA: Diagnosis not present

## 2022-02-13 DIAGNOSIS — E7800 Pure hypercholesterolemia, unspecified: Secondary | ICD-10-CM | POA: Diagnosis not present

## 2022-02-13 DIAGNOSIS — Z1389 Encounter for screening for other disorder: Secondary | ICD-10-CM | POA: Diagnosis not present

## 2022-02-13 DIAGNOSIS — R7303 Prediabetes: Secondary | ICD-10-CM | POA: Diagnosis not present

## 2022-02-28 NOTE — Progress Notes (Signed)
Cardiology Office Note:   Date:  03/01/2022  NAME:  Hector LemmingMichael Versteeg    MRN: [id number] DOB:  1976/04/17   PCP:  Shon Haleimberlake, Kathryn S, MD  Cardiologist:  None  Electrophysiologist:  None   Referring MD: Salley Scarleturham, Kawanta F, MD   Chief Complaint  Patient presents with   Palpitations         History of Present Illness:   Hector Peters is a 46 y.o. male with a hx of obesity, HTN, OSA who is being seen today for the evaluation of palpitations at the request of Shon Haleimberlake, Kathryn S, MD. he reports for the last year has had daily episodes of palpitations.  Reports rapid heartbeat sensation that can occur in the middle of the day.  Reports he can last 5 to 10 minutes.  He does report shortness of breath with it.  He also reports right-sided stabbing chest pain when this occurs.  Symptoms resolve and go away.  He reports he is not stressed out.  No depression.  His blood pressure is well controlled.  He has severe sleep apnea.  He is a former smoker.  Recently quit.  He is using his CPAP machine and getting good readings.  He reports that he exercises 4 days/week.  He is recently lost 30 pounds.  He tells me that he works out with a trainer 4 days/week.  This can include intense aerobic and weight training.  He reports no limitations with this.  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  He reports work does not bother him.  He has 5 children.  Reports no significant stress.  Can feel palpitations at night when he lays down.  He does have sleep apnea but is getting good readings.  His EKG demonstrates sinus rhythm with left axis deviation.  CV exam is normal.  He does have a family history of heart disease in his grandparents on both sides.  We discussed calcium scoring.  He is interested.  He has never had a heart attack or stroke.  Former smoker.  No excess alcohol use.  No drug use.  Problem List Obesity HTN Severe OSA  Past Medical History: Past Medical History:   Diagnosis Date   Diverticulitis 2017   microperforation   Family history of colon cancer in father    GERD (gastroesophageal reflux disease)    Hypertension    Morbid obesity with BMI of 45.0-49.9, adult (HCC)    OSA (obstructive sleep apnea) 02/2019   SEVERE OSA on home sleep study (Dr. Maple HudsonYoung)   Peripheral edema    chlorthalidone for ankle swelling (per past PCP in [place]).   Prediabetes 10/2018   fasting gluc 114, hbA1c 6.2%.  Rpt A1c 03/2019= 6.2%   Tobacco abuse     Past Surgical History: Past Surgical History:  Procedure Laterality Date   COLONOSCOPY  2015; 2018   2015 screening due to +FH -->normal.  2018 Normal (done after divertic with microperf).   Recall 2021.   microperforation  2017   Diverticulitis   POLYSOMNOGRAPHY  02/2019   Severe OSA (Dr. Maple Hudsonyoung)    Current Medications: Current Meds  Medication Sig   amoxicillin (AMOXIL) 500 MG capsule Take 500 mg by mouth in the morning and at bedtime. For 8 days   meloxicam (MOBIC) 15 MG tablet Take 7.5 mg by mouth daily.   Probiotic Product (PROBIOTIC DAILY) CAPS Take by mouth.   valsartan (DIOVAN) 80 MG tablet Take 80 mg by mouth  daily.     Allergies:    Patient has no known allergies.   Social History: Social History   Socioeconomic History   Marital status: Married    Spouse name: Not on file   Number of children: 5   Years of education: Not on file   Highest education level: Not on file  Occupational History   Occupation: Art therapist [employer]  Tobacco Use   Smoking status: Former    Packs/day: 1.00    Years: 20.00    Total pack years: 20.00    Types: Cigarettes   Smokeless tobacco: Never  Vaping Use   Vaping Use: Never used  Substance and Sexual Activity   Alcohol use: Yes    Comment: occasionally   Drug use: Never   Sexual activity: Not on file  Other Topics Concern   Not on file  Social History Narrative   Married, 5 children (4 in college as of 08/2018.)   Educ: HS   Occup: Radiographer, therapeutic at [employer]   Tobacco:20 pack-yr hx, active as of 09/16/2018.   Alc: social drinker.   No drugs.   Social Determinants of Health   Financial Resource Strain: Not on file  Food Insecurity: Not on file  Transportation Needs: Not on file  Physical Activity: Not on file  Stress: Not on file  Social Connections: Not on file     Family History: The patient's family history includes Alcohol abuse in his father; Arthritis in his father; COPD in his maternal grandmother; Cancer in his father; Colon cancer in his father; Heart attack in his maternal grandfather; Heart disease in his maternal grandmother and paternal grandmother; Hypertension in his father, mother, and sister; Lung cancer in his paternal grandfather; Testicular cancer in his father.  ROS:   All other ROS reviewed and negative. Pertinent positives noted in the HPI.     EKGs/Labs/Other Studies Reviewed:   The following studies were personally reviewed by me today:  EKG:  EKG is ordered today.  The ekg ordered today demonstrates normal sinus rhythm heart rate 69, no acute ischemic changes or evidence of infarction, and was personally reviewed by me.   Recent Labs: No results found for requested labs within last 365 days.   Recent Lipid Panel    Component Value Date/Time   CHOL 169 10/20/2018 0840   TRIG 125.0 10/20/2018 0840   HDL 31.10 (L) 10/20/2018 0840   CHOLHDL 5 10/20/2018 0840   VLDL 25.0 10/20/2018 0840   LDLCALC 113 (H) 10/20/2018 0840    Physical Exam:   VS:  BP 135/72 (BP Location: Left Arm)   Pulse 69   Ht 5' 7.5" (1.715 m)   Wt 284 lb 12.8 oz (129.2 kg)   SpO2 92%   BMI 43.95 kg/m    Wt Readings from Last 3 Encounters:  03/01/22 284 lb 12.8 oz (129.2 kg)  06/11/19 292 lb (132.5 kg)  02/12/19 288 lb 9.6 oz (130.9 kg)    General: Well nourished, well developed, in no acute distress Head: Atraumatic, normal size  Eyes: PEERLA, EOMI  Neck: Supple, no JVD Endocrine: No  thryomegaly Cardiac: Normal S1, S2; RRR; no murmurs, rubs, or gallops Lungs: Clear to auscultation bilaterally, no wheezing, rhonchi or rales  Abd: Soft, nontender, no hepatomegaly  Ext: No edema, pulses 2+ Musculoskeletal: No deformities, BUE and BLE strength normal and equal Skin: Warm and dry, no rashes   Neuro: Alert and oriented to person, place, time, and situation, CNII-XII  grossly intact, no focal deficits  Psych: Normal mood and affect   ASSESSMENT:   Brentley Landfair is a 46 y.o. male who presents for the following: 1. Palpitations   2. Primary hypertension   3. Obesity, morbid, BMI 40.0-49.9 (HCC)     PLAN:   1. Palpitations -Daily palpitations with shortness of breath and right-sided chest discomfort.  Symptoms only occur with rapid heartbeat sensation.  Reports no significant stress.  Needs a TSH today.  His EKG demonstrates sinus rhythm with left axis deviation.  No signs of heart failure.  CV exam is normal.  We will set him up for a 7-day ZIO to exclude arrhythmia.  He is morbidly obese with hypertension and sleep apnea.  He informs me that his sleep apnea is well treated.  He can maintain a high level of activity including gym 4 days/week.  He works with a Psychologist, educational.  He reports no symptoms with this.  He has lost 30 pounds.  We will base follow-up on his results.  We also discussed calcium scoring which is not a bad idea given his family history.  We will notify him of his results by phone.  2. Primary hypertension -Well-controlled.  No change to medication.  3. Obesity, morbid, BMI 40.0-49.9 (HCC) -Diet and exercise recommended.      Disposition: Return if symptoms worsen or fail to improve.  Medication Adjustments/Labs and Tests Ordered: Current medicines are reviewed at length with the patient today.  Concerns regarding medicines are outlined above.  Orders Placed This Encounter  Procedures   CT CARDIAC SCORING ([facility] LOCATIONS ONLY)   TSH   LONG TERM MONITOR  (3-14 DAYS)   EKG 12-Lead   No orders of the defined types were placed in this encounter.   Patient Instructions  Medication Instructions:  The current medical regimen is effective;  continue present plan and medications.  *If you need a refill on your cardiac medications before your next appointment, please call your pharmacy*   Lab Work: TSH today   If you have labs (blood work) drawn today and your tests are completely normal, you will receive your results only by: MyChart Message (if you have MyChart) OR A paper copy in the mail If you have any lab test that is abnormal or we need to change your treatment, we will call you to review the results.   Testing/Procedures:  CALCIUM SCORE  ZIO XT- Long Term Monitor Instructions  Your physician has requested you wear a ZIO patch monitor for 7 days.  This is a single patch monitor. [employer] supplies one patch monitor per enrollment. Additional stickers are not available. Please do not apply patch if you will be having a Nuclear Stress Test,  Echocardiogram, Cardiac CT, MRI, or Chest Xray during the period you would be wearing the  monitor. The patch cannot be worn during these tests. You cannot remove and re-apply the  ZIO XT patch monitor.  Your ZIO patch monitor will be mailed 3 day USPS to your address on file. It may take 3-5 days  to receive your monitor after you have been enrolled.  Once you have received your monitor, please review the enclosed instructions. Your monitor  has already been registered assigning a specific monitor serial # to you.  Billing and Patient Assistance Program Information  We have supplied [employer] with any of your insurance information on file for billing purposes. [employer] offers a sliding scale Patient Assistance Program for patients that do not have  insurance,  or whose insurance does not completely cover the cost of the ZIO monitor.  You must apply for the Patient Assistance Program to qualify  for this discounted rate.  To apply, please call [employer] at [phone], select option 4, select option 2, ask to apply for  Patient Assistance Program. Meredeth Ide will ask your household income, and how many people  are in your household. They will quote your out-of-pocket cost based on that information.  [employer] will also be able to set up a 68-month, interest-free payment plan if needed.  Applying the monitor   Shave hair from upper left chest.  Hold abrader disc by orange tab. Rub abrader in 40 strokes over the upper left chest as  indicated in your monitor instructions.  Clean area with 4 enclosed alcohol pads. Let dry.  Apply patch as indicated in monitor instructions. Patch will be placed under collarbone on left  side of chest with arrow pointing upward.  Rub patch adhesive wings for 2 minutes. Remove white label marked "1". Remove the white  label marked "2". Rub patch adhesive wings for 2 additional minutes.  While looking in a mirror, press and release button in center of patch. A small green light will  flash 3-4 times. This will be your only indicator that the monitor has been turned on.  Do not shower for the first 24 hours. You may shower after the first 24 hours.  Press the button if you feel a symptom. You will hear a small click. Record Date, Time and  Symptom in the Patient Logbook.  When you are ready to remove the patch, follow instructions on the last 2 pages of Patient  Logbook. Stick patch monitor onto the last page of Patient Logbook.  Place Patient Logbook in the blue and white box. Use locking tab on box and tape box closed  securely. The blue and white box has prepaid postage on it. Please place it in the mailbox as  soon as possible. Your physician should have your test results approximately 7 days after the  monitor has been mailed back to [facility].  Call [facility] Customer Care at [phone] if you have questions regarding  your ZIO XT patch  monitor. Call them immediately if you see an orange light blinking on your  monitor.  If your monitor falls off in less than 4 days, contact our Monitor department at [phone].  If your monitor becomes loose or falls off after 4 days call [employer] at [phone] for  suggestions on securing your monitor    Follow-Up: At [facility], you and your health needs are our priority.  As part of our continuing mission to provide you with exceptional heart care, we have created designated Provider Care Teams.  These Care Teams include your primary Cardiologist (physician) and Advanced Practice Providers (APPs -  Physician Assistants and Nurse Practitioners) who all work together to provide you with the care you need, when you need it.  We recommend signing up for the patient portal called "MyChart".  Sign up information is provided on this After Visit Summary.  MyChart is used to connect with patients for Virtual Visits (Telemedicine).  Patients are able to view lab/test results, encounter notes, upcoming appointments, etc.  Non-urgent messages can be sent to your provider as well.   To learn more about what you can do with MyChart, go to [email or web address].    Your next appointment:   As needed  The format for your next appointment:   In  Person  Provider:   Lennie Odor, MD            Signed, Lenna Gilford. Flora Lipps, MD, [facility]  [facility]  [address], [address] [place], [place] [address] [phone]  03/01/2022 4:29 PM

## 2022-03-01 ENCOUNTER — Ambulatory Visit: Payer: BLUE CROSS/BLUE SHIELD | Admitting: Cardiovascular Disease

## 2022-03-01 ENCOUNTER — Encounter: Payer: Self-pay | Admitting: Cardiovascular Disease

## 2022-03-01 ENCOUNTER — Ambulatory Visit (INDEPENDENT_AMBULATORY_CARE_PROVIDER_SITE_OTHER): Payer: BLUE CROSS/BLUE SHIELD

## 2022-03-01 VITALS — BP 135/72 | HR 69 | Ht 67.5 in | Wt 284.8 lb

## 2022-03-01 DIAGNOSIS — I10 Essential (primary) hypertension: Secondary | ICD-10-CM | POA: Diagnosis not present

## 2022-03-01 DIAGNOSIS — R002 Palpitations: Secondary | ICD-10-CM | POA: Diagnosis not present

## 2022-03-01 NOTE — Progress Notes (Unsigned)
Enrolled patient for a 7 day Zio XT monitor to be mailed to patients home.  

## 2022-03-01 NOTE — Patient Instructions (Signed)
Medication Instructions:  The current medical regimen is effective;  continue present plan and medications.  *If you need a refill on your cardiac medications before your next appointment, please call your pharmacy*   Lab Work: TSH today   If you have labs (blood work) drawn today and your tests are completely normal, you will receive your results only by: MyChart Message (if you have MyChart) OR A paper copy in the mail If you have any lab test that is abnormal or we need to change your treatment, we will call you to review the results.   Testing/Procedures:  CALCIUM SCORE  ZIO XT- Long Term Monitor Instructions  Your physician has requested you wear a ZIO patch monitor for 7 days.  This is a single patch monitor. [employer] supplies one patch monitor per enrollment. Additional stickers are not available. Please do not apply patch if you will be having a Nuclear Stress Test,  Echocardiogram, Cardiac CT, MRI, or Chest Xray during the period you would be wearing the  monitor. The patch cannot be worn during these tests. You cannot remove and re-apply the  ZIO XT patch monitor.  Your ZIO patch monitor will be mailed 3 day USPS to your address on file. It may take 3-5 days  to receive your monitor after you have been enrolled.  Once you have received your monitor, please review the enclosed instructions. Your monitor  has already been registered assigning a specific monitor serial # to you.  Billing and Patient Assistance Program Information  We have supplied [employer] with any of your insurance information on file for billing purposes. [employer] offers a sliding scale Patient Assistance Program for patients that do not have  insurance, or whose insurance does not completely cover the cost of the ZIO monitor.  You must apply for the Patient Assistance Program to qualify for this discounted rate.  To apply, please call [employer] at [phone], select option 4, select option 2, ask to apply  for  Patient Assistance Program. Meredeth Ide will ask your household income, and how many people  are in your household. They will quote your out-of-pocket cost based on that information.  [employer] will also be able to set up a 70-month, interest-free payment plan if needed.  Applying the monitor   Shave hair from upper left chest.  Hold abrader disc by orange tab. Rub abrader in 40 strokes over the upper left chest as  indicated in your monitor instructions.  Clean area with 4 enclosed alcohol pads. Let dry.  Apply patch as indicated in monitor instructions. Patch will be placed under collarbone on left  side of chest with arrow pointing upward.  Rub patch adhesive wings for 2 minutes. Remove white label marked "1". Remove the white  label marked "2". Rub patch adhesive wings for 2 additional minutes.  While looking in a mirror, press and release button in center of patch. A small green light will  flash 3-4 times. This will be your only indicator that the monitor has been turned on.  Do not shower for the first 24 hours. You may shower after the first 24 hours.  Press the button if you feel a symptom. You will hear a small click. Record Date, Time and  Symptom in the Patient Logbook.  When you are ready to remove the patch, follow instructions on the last 2 pages of Patient  Logbook. Stick patch monitor onto the last page of Patient Logbook.  Place Patient Logbook in the blue and white  box. Use locking tab on box and tape box closed  securely. The blue and white box has prepaid postage on it. Please place it in the mailbox as  soon as possible. Your physician should have your test results approximately 7 days after the  monitor has been mailed back to [facility].  Call [facility] Customer Care at [phone] if you have questions regarding  your ZIO XT patch monitor. Call them immediately if you see an orange light blinking on your  monitor.  If your monitor falls off in less than  4 days, contact our Monitor department at [phone].  If your monitor becomes loose or falls off after 4 days call [employer] at [phone] for  suggestions on securing your monitor    Follow-Up: At [facility], you and your health needs are our priority.  As part of our continuing mission to provide you with exceptional heart care, we have created designated Provider Care Teams.  These Care Teams include your primary Cardiologist (physician) and Advanced Practice Providers (APPs -  Physician Assistants and Nurse Practitioners) who all work together to provide you with the care you need, when you need it.  We recommend signing up for the patient portal called "MyChart".  Sign up information is provided on this After Visit Summary.  MyChart is used to connect with patients for Virtual Visits (Telemedicine).  Patients are able to view lab/test results, encounter notes, upcoming appointments, etc.  Non-urgent messages can be sent to your provider as well.   To learn more about what you can do with MyChart, go to [email or web address].    Your next appointment:   As needed  The format for your next appointment:   In Person  Provider:   Lennie Odor, MD

## 2022-03-02 LAB — TSH: TSH: 0.949 u[IU]/mL (ref 0.450–4.500)

## 2022-03-06 DIAGNOSIS — R002 Palpitations: Secondary | ICD-10-CM

## 2022-03-20 DIAGNOSIS — R002 Palpitations: Secondary | ICD-10-CM | POA: Diagnosis not present

## 2022-04-04 ENCOUNTER — Telehealth: Payer: Self-pay

## 2022-04-04 ENCOUNTER — Telehealth: Payer: Self-pay | Admitting: Cardiovascular Disease

## 2022-04-04 NOTE — Telephone Encounter (Signed)
Attempted to call Hector Peters back regarding medical clearance. Spoke with Stanton Kidney regarding this. She states that Hector Peters is in surgery with the Dr. Precious Haws left with pt's information to resend clearance. Fax number provided: [phone]. Stanton Kidney states that they will re-send clearance over to [place].

## 2022-04-04 NOTE — Telephone Encounter (Signed)
   Patient Name: Hector Peters  DOB: 15-Aug-1976 MRN: [id number]  Primary Cardiologist: None  Chart reviewed as part of pre-operative protocol coverage.   Simple dental extractions (i.e. 1-2 teeth) are considered low risk procedures per guidelines and generally do not require any specific cardiac clearance. It is also generally accepted that for simple extractions and dental cleanings, there is no need to interrupt blood thinner therapy.  SBE prophylaxis is not required for the patient from a cardiac standpoint.  I will route this recommendation to the requesting party via Epic fax function and remove from pre-op pool.  Please call with questions.  Joylene Grapes, NP 04/04/2022, 12:30 PM

## 2022-04-04 NOTE — Telephone Encounter (Signed)
   Pre-operative Risk Assessment    Patient Name: Hector Peters  DOB: 11/20/1975 MRN: [id number]     Request for Surgical Clearance    Procedure:  Dental Extraction - Amount of Teeth to be Pulled:  1  Date of Surgery:  Clearance TBD                                 Surgeon:  Dr. Augustine Radar Surgeon's Group or Practice Name:  [facility]  Phone number:  [phone] Fax number:  [phone]   Type of Clearance Requested:   - Medical    Type of Anesthesia:   IV sedation   Additional requests/questions:  Please fax a copy of EKG and Zio monitor results to the surgeon's office.  SignedEverardo All L   04/04/2022, 12:03 PM

## 2022-04-04 NOTE — Telephone Encounter (Signed)
Fax with clearance received and entered into pt's chart and sent to pre-op pool.

## 2022-04-04 NOTE — Telephone Encounter (Signed)
Calling to F/U on Medical Clearance that was sent via fax on 03/06/22. Office is also requesting pt's most resent EKG and Zio Monitor results. Please advise

## 2022-04-12 ENCOUNTER — Telehealth: Payer: Self-pay | Admitting: Cardiovascular Disease

## 2022-04-12 NOTE — Telephone Encounter (Signed)
Follow Up:      Hector Peters is calling to check on the status of patient's clearance, Please fax to  [phone].

## 2022-04-30 NOTE — Progress Notes (Unsigned)
Cardiology Office Note:   Date:  05/01/2022  NAME:  Hector Peters    MRN: [id number] DOB:  27-Nov-1975   PCP:  Shon Hale, MD  Cardiologist:  None  Electrophysiologist:  None   Referring MD: Shon Hale, *   Chief Complaint  Patient presents with   Follow-up        History of Present Illness:   Hector Peters is a 46 y.o. male with a hx of OSA, hypertension who presents for follow-up of PVCs.  We discussed the results of his monitor.  They show a 4% PVC burden.  His blood pressure is a bit elevated but he reports it is normally well controlled.  He denies any chest pain or trouble breathing.  Working with a Psychologist, educational 3 days/week.  Doing lots of activity.  No limitations.  He is working on losing weight as well.  He is still having palpitations but they are occurring less frequently.  They are occurring 3-4 times per week.  They are associated with stress.  He is working on cutting back on caffeine and has noted improvement.  We discussed that he needs an ultrasound of his heart.  We also discussed treating his PVCs.  For now he would like to hold on this.  We discussed being reevaluated by sleep medicine as well as watching his blood pressure.  Both of these can result in PVCs.  He is okay with a conservative approach to begin with.  He would like to hold on starting medication at this time.  Problem List Obesity HTN Severe OSA PVCs -4.1% burden   Past Medical History: Past Medical History:  Diagnosis Date   Diverticulitis 2017   microperforation   Family history of colon cancer in father    GERD (gastroesophageal reflux disease)    Hypertension    Morbid obesity with BMI of 45.0-49.9, adult (HCC)    OSA (obstructive sleep apnea) 02/2019   SEVERE OSA on home sleep study (Dr. Maple Hudson)   Peripheral edema    chlorthalidone for ankle swelling (per past PCP in [place]).   Prediabetes 10/2018   fasting gluc 114, hbA1c 6.2%.  Rpt A1c 03/2019= 6.2%   Tobacco abuse      Past Surgical History: Past Surgical History:  Procedure Laterality Date   COLONOSCOPY  2015; 2018   2015 screening due to +FH -->normal.  2018 Normal (done after divertic with microperf).   Recall 2021.   microperforation  2017   Diverticulitis   POLYSOMNOGRAPHY  02/2019   Severe OSA (Dr. Maple Hudson)    Current Medications: Current Meds  Medication Sig   meloxicam (MOBIC) 15 MG tablet Take 7.5 mg by mouth daily.   Probiotic Product (PROBIOTIC DAILY) CAPS Take by mouth.   valsartan (DIOVAN) 80 MG tablet Take 80 mg by mouth daily.     Allergies:    Patient has no known allergies.   Social History: Social History   Socioeconomic History   Marital status: Married    Spouse name: Not on file   Number of children: 5   Years of education: Not on file   Highest education level: Not on file  Occupational History   Occupation: Art therapist [employer]  Tobacco Use   Smoking status: Former    Packs/day: 1.00    Years: 20.00    Total pack years: 20.00    Types: Cigarettes   Smokeless tobacco: Never  Vaping Use   Vaping Use: Never used  Substance and Sexual  Activity   Alcohol use: Yes    Comment: occasionally   Drug use: Never   Sexual activity: Not on file  Other Topics Concern   Not on file  Social History Narrative   Married, 5 children (4 in college as of 08/2018.)   Educ: HS   Occup: Art therapist at [employer]   Tobacco:20 pack-yr hx, active as of 09/16/2018.   Alc: social drinker.   No drugs.   Social Determinants of Health   Financial Resource Strain: Not on file  Food Insecurity: Not on file  Transportation Needs: Not on file  Physical Activity: Not on file  Stress: Not on file  Social Connections: Not on file     Family History: The patient's family history includes Alcohol abuse in his father; Arthritis in his father; COPD in his maternal grandmother; Cancer in his father; Colon cancer in his father; Heart attack in his maternal  grandfather; Heart disease in his maternal grandmother and paternal grandmother; Hypertension in his father, mother, and sister; Lung cancer in his paternal grandfather; Testicular cancer in his father.  ROS:   All other ROS reviewed and negative. Pertinent positives noted in the HPI.     EKGs/Labs/Other Studies Reviewed:   The following studies were personally reviewed by me today:   Zio 03/01/2022 Impression: No arrhythmias detected.  Occasional PVCs (4.1% burden).   Recent Labs: 03/01/2022: TSH 0.949   Recent Lipid Panel    Component Value Date/Time   CHOL 169 10/20/2018 0840   TRIG 125.0 10/20/2018 0840   HDL 31.10 (L) 10/20/2018 0840   CHOLHDL 5 10/20/2018 0840   VLDL 25.0 10/20/2018 0840   LDLCALC 113 (H) 10/20/2018 0840    Physical Exam:   VS:  BP (!) 150/83   Pulse 71   Ht 5\' 7"  (1.702 m)   Wt 281 lb 3.2 oz (127.6 kg)   SpO2 100%   BMI 44.04 kg/m    Wt Readings from Last 3 Encounters:  05/01/22 281 lb 3.2 oz (127.6 kg)  03/01/22 284 lb 12.8 oz (129.2 kg)  06/11/19 292 lb (132.5 kg)    General: Well nourished, well developed, in no acute distress Head: Atraumatic, normal size  Eyes: PEERLA, EOMI  Neck: Supple, no JVD Endocrine: No thryomegaly Cardiac: Normal S1, S2; RRR; no murmurs, rubs, or gallops Lungs: Clear to auscultation bilaterally, no wheezing, rhonchi or rales  Abd: Soft, nontender, no hepatomegaly  Ext: No edema, pulses 2+ Musculoskeletal: No deformities, BUE and BLE strength normal and equal Skin: Warm and dry, no rashes   Neuro: Alert and oriented to person, place, time, and situation, CNII-XII grossly intact, no focal deficits  Psych: Normal mood and affect   ASSESSMENT:   Hector Peters is a 46 y.o. male who presents for the following: 1. Palpitations   2. PVC (premature ventricular contraction)     PLAN:   1. Palpitations 2. PVC (premature ventricular contraction) -4% PVC burden.  Needs an echocardiogram.  Thyroid studies have been  normal this year.  Symptoms of palpitations have improved with reduction in caffeine.  His blood pressure is a bit elevated and he will work on monitoring this.  He wants to hold on medication for now.  He will get a calcium scoring for further evaluation as well.  We also discussed that poorly controlled sleep apnea could be causing this.  He will reach out to his sleep medicine doctor to determine if he needs reevaluation.  He will see me back  in 6 months.  We will hold on medication at this time.  Disposition: Return in about 6 months (around 10/30/2022).  Medication Adjustments/Labs and Tests Ordered: Current medicines are reviewed at length with the patient today.  Concerns regarding medicines are outlined above.  Orders Placed This Encounter  Procedures   ECHOCARDIOGRAM COMPLETE   No orders of the defined types were placed in this encounter.   Patient Instructions  Medication Instructions:  The current medical regimen is effective;  continue present plan and medications.  *If you need a refill on your cardiac medications before your next appointment, please call your pharmacy*   Testing/Procedures: Echocardiogram - Your physician has requested that you have an echocardiogram. Echocardiography is a painless test that uses sound waves to create images of your heart. It provides your doctor with information about the size and shape of your heart and how well your heart's chambers and valves are working. This procedure takes approximately one hour. There are no restrictions for this procedure.     Follow-Up: At [facility], you and your health needs are our priority.  As part of our continuing mission to provide you with exceptional heart care, we have created designated Provider Care Teams.  These Care Teams include your primary Cardiologist (physician) and Advanced Practice Providers (APPs -  Physician Assistants and Nurse Practitioners) who all work together to provide you  with the care you need, when you need it.  We recommend signing up for the patient portal called "MyChart".  Sign up information is provided on this After Visit Summary.  MyChart is used to connect with patients for Virtual Visits (Telemedicine).  Patients are able to view lab/test results, encounter notes, upcoming appointments, etc.  Non-urgent messages can be sent to your provider as well.   To learn more about what you can do with MyChart, go to [email or web address].    Your next appointment:   6 month(s)  The format for your next appointment:   In Person  Provider:   Lennie Odor, MD    Check BP every 2 days.        Time Spent with Patient: I have spent a total of 35 minutes with patient reviewing hospital notes, telemetry, EKGs, labs and examining the patient as well as establishing an assessment and plan that was discussed with the patient.  > 50% of time was spent in direct patient care.  Signed, Lenna Gilford. Flora Lipps, MD, [facility]  [facility]  [address] [place], [place] [address] [phone]  05/01/2022 9:58 AM

## 2022-05-01 ENCOUNTER — Ambulatory Visit: Payer: BLUE CROSS/BLUE SHIELD | Attending: Cardiovascular Disease | Admitting: Cardiovascular Disease

## 2022-05-01 ENCOUNTER — Encounter: Payer: Self-pay | Admitting: Cardiovascular Disease

## 2022-05-01 VITALS — BP 150/83 | HR 71 | Ht 67.0 in | Wt 281.2 lb

## 2022-05-01 DIAGNOSIS — I493 Ventricular premature depolarization: Secondary | ICD-10-CM | POA: Diagnosis not present

## 2022-05-01 DIAGNOSIS — R002 Palpitations: Secondary | ICD-10-CM | POA: Diagnosis not present

## 2022-05-01 NOTE — Patient Instructions (Signed)
Medication Instructions:  The current medical regimen is effective;  continue present plan and medications.  *If you need a refill on your cardiac medications before your next appointment, please call your pharmacy*   Testing/Procedures: Echocardiogram - Your physician has requested that you have an echocardiogram. Echocardiography is a painless test that uses sound waves to create images of your heart. It provides your doctor with information about the size and shape of your heart and how well your heart's chambers and valves are working. This procedure takes approximately one hour. There are no restrictions for this procedure.     Follow-Up: At [facility], you and your health needs are our priority.  As part of our continuing mission to provide you with exceptional heart care, we have created designated Provider Care Teams.  These Care Teams include your primary Cardiologist (physician) and Advanced Practice Providers (APPs -  Physician Assistants and Nurse Practitioners) who all work together to provide you with the care you need, when you need it.  We recommend signing up for the patient portal called "MyChart".  Sign up information is provided on this After Visit Summary.  MyChart is used to connect with patients for Virtual Visits (Telemedicine).  Patients are able to view lab/test results, encounter notes, upcoming appointments, etc.  Non-urgent messages can be sent to your provider as well.   To learn more about what you can do with MyChart, go to [email or web address].    Your next appointment:   6 month(s)  The format for your next appointment:   In Person  Provider:   Lennie Odor, MD    Check BP every 2 days.

## 2022-05-14 ENCOUNTER — Ambulatory Visit (HOSPITAL_COMMUNITY): Payer: BLUE CROSS/BLUE SHIELD | Attending: Cardiovascular Disease

## 2022-05-14 DIAGNOSIS — I493 Ventricular premature depolarization: Secondary | ICD-10-CM | POA: Diagnosis not present

## 2022-05-14 DIAGNOSIS — I371 Nonrheumatic pulmonary valve insufficiency: Secondary | ICD-10-CM | POA: Diagnosis not present

## 2022-05-14 DIAGNOSIS — R002 Palpitations: Secondary | ICD-10-CM | POA: Insufficient documentation

## 2022-05-14 LAB — ECHOCARDIOGRAM COMPLETE
Area-P 1/2: 3.99 cm2
S' Lateral: 3.9 cm

## 2022-05-22 ENCOUNTER — Ambulatory Visit
Admission: RE | Admit: 2022-05-22 | Discharge: 2022-05-22 | Disposition: A | Discharge status: Home / Self Care | Payer: No Typology Code available for payment source | Source: Ambulatory Visit | Attending: Cardiovascular Disease | Admitting: Cardiovascular Disease

## 2022-05-22 DIAGNOSIS — R002 Palpitations: Secondary | ICD-10-CM

## 2022-06-21 DIAGNOSIS — R7309 Other abnormal glucose: Secondary | ICD-10-CM | POA: Diagnosis not present

## 2022-06-21 DIAGNOSIS — Z13220 Encounter for screening for lipoid disorders: Secondary | ICD-10-CM | POA: Diagnosis not present

## 2022-06-21 DIAGNOSIS — Z0000 Encounter for general adult medical examination without abnormal findings: Secondary | ICD-10-CM | POA: Diagnosis not present

## 2022-06-21 DIAGNOSIS — I10 Essential (primary) hypertension: Secondary | ICD-10-CM | POA: Diagnosis not present

## 2022-06-21 DIAGNOSIS — R739 Hyperglycemia, unspecified: Secondary | ICD-10-CM | POA: Diagnosis not present

## 2022-11-06 ENCOUNTER — Ambulatory Visit: Payer: BC Managed Care – PPO | Admitting: Cardiovascular Disease

## 2022-11-07 ENCOUNTER — Other Ambulatory Visit: Payer: Self-pay | Admitting: Urology

## 2022-11-07 DIAGNOSIS — N281 Cyst of kidney, acquired: Secondary | ICD-10-CM

## 2022-11-22 DIAGNOSIS — I10 Essential (primary) hypertension: Secondary | ICD-10-CM | POA: Diagnosis not present

## 2022-11-22 DIAGNOSIS — Z0000 Encounter for general adult medical examination without abnormal findings: Secondary | ICD-10-CM | POA: Diagnosis not present

## 2022-11-22 DIAGNOSIS — R7303 Prediabetes: Secondary | ICD-10-CM | POA: Diagnosis not present

## 2022-11-22 DIAGNOSIS — E7800 Pure hypercholesterolemia, unspecified: Secondary | ICD-10-CM | POA: Diagnosis not present

## 2023-11-05 ENCOUNTER — Telehealth: Payer: Self-pay

## 2023-11-05 NOTE — Telephone Encounter (Signed)
 Inbound call from patient's wife, wishing to schedule a colonoscopy with Dr. Chales Abrahams, she states he had one in 2020 with our practice. Advised wife it was not done within [facility] due to records not showing up. She stated it was done on [address]. She hung up.

## 2023-11-19 ENCOUNTER — Encounter: Payer: Self-pay | Admitting: Podiatry

## 2023-11-19 ENCOUNTER — Ambulatory Visit: Admitting: Podiatry

## 2023-11-19 DIAGNOSIS — S90424A Blister (nonthermal), right lesser toe(s), initial encounter: Secondary | ICD-10-CM | POA: Diagnosis not present

## 2023-11-19 DIAGNOSIS — B353 Tinea pedis: Secondary | ICD-10-CM

## 2023-11-19 MED ORDER — KETOCONAZOLE 2 % EX CREA: 1.0000 | TOPICAL_CREAM | Freq: Every day | CUTANEOUS | 2 refills | Status: DC

## 2023-11-19 NOTE — Progress Notes (Signed)
  Subjective:  Patient ID: Nazire Fruth, male    DOB: [birth year],   MRN: [id number]  Chief Complaint  Patient presents with   Callouses    Pt presents for a painful callousse on the bottom of left 5th states that its been there for a month and it's getting  very painful.    48 y.o. male presents for concern for area on the bottom of his right foot that has been present for a few weeks. Was seen by primary doctor and concern for infection or blister. He does relate a history of athletes foot he has dealt with over the years. Denies itching currently but does get a lot of sweating in the feet . Denies any other pedal complaints. Denies n/v/f/c.   Past Medical History:  Diagnosis Date   Diverticulitis 2017   microperforation   Family history of colon cancer in father    GERD (gastroesophageal reflux disease)    Hypertension    Morbid obesity with BMI of 45.0-49.9, adult (HCC)    OSA (obstructive sleep apnea) 02/2019   SEVERE OSA on home sleep study (Dr. Maple Hudson)   Peripheral edema    chlorthalidone for ankle swelling (per past PCP in [place]).   Prediabetes 10/2018   fasting gluc 114, hbA1c 6.2%.  Rpt A1c 03/2019= 6.2%   Tobacco abuse     Objective:  Physical Exam: Vascular: DP/PT pulses 2/4 bilateral. CFT <3 seconds. Normal hair growth on digits. No edema.  Skin. No lacerations or abrasions bilateral feet. Hyperkeartotis and dried blood noted to plantar fifth metatarsal head and sulcus of the fifth digit. Tender to palpation.  Musculoskeletal: MMT 5/5 bilateral lower extremities in DF, PF, Inversion and Eversion. Deceased ROM in DF of ankle joint.  Neurological: Sensation intact to light touch.   Assessment:   1. Blister of fifth toe of right foot, initial encounter   2. Tinea pedis of right foot      Plan:  Patient was evaluated and treated and all questions answered. -Discussed blisters and callus vs tinea pedis with patient and treatment options.  -Unclear the etiology   -Hyperkeratotic tissue was debrided with chisel without incident as courtesy.  -Ketoconazole provided for concern of tinea.  -Discussed anti-per spirant for feet.  -Encouraged daily moisturizing -Discussed use of pumice stone -Advised good supportive shoes and inserts -Patient to return to office as needed or sooner if condition worsens.   Louann Sjogren, DPM

## 2023-11-25 ENCOUNTER — Telehealth: Payer: Self-pay | Admitting: *Deleted

## 2023-11-25 NOTE — Telephone Encounter (Signed)
 Pt has been scheduled to see Azalee Course, [facility] 12/11/23 8:50 for preop clearance. Pt aware of location for appt.

## 2023-11-25 NOTE — Telephone Encounter (Signed)
   Pre-operative Risk Assessment    Patient Name: Hector Peters  DOB: 1975/12/22 MRN: [id number]   Date of last office visit: 05/01/22 [facility] THOMAS O'NEAL  Date of next office visit: NONE   Request for Surgical Clearance    Procedure:   COLONOSCOPY  (H/O DIVERTICULA, FAMILY H/O COLON CANCER) ALSO NOTED ON REQUEST (PT HAS H/O OF LEFT ANTERIOR FASCICULAR BLOCK (LAFB)  Date of Surgery:  Clearance 12/24/23                                Surgeon:  DR. PARAG BRAHMBHATT Surgeon's Group or Practice Name:  [facility] Phone number:  [phone] Fax number:  [phone]   Type of Clearance Requested:   - Medical ; NONE INDICATED TO BE HELD   Type of Anesthesia:   PROPOFOL   Additional requests/questions:    Elpidio Anis   11/25/2023, 10:47 AM

## 2023-11-25 NOTE — Telephone Encounter (Signed)
   Name: Hector Peters  DOB: 10-31-1975  MRN: [id number]  Primary Cardiologist: None  Chart reviewed as part of pre-operative protocol coverage. Because of Akio Hudnall past medical history and time since last visit, he will require a follow-up in-office visit in order to better assess preoperative cardiovascular risk.  Pre-op covering staff: - Please schedule appointment and call patient to inform them. If patient already had an upcoming appointment within acceptable timeframe, please add "pre-op clearance" to the appointment notes so provider is aware. - Please contact requesting surgeon's office via preferred method (i.e, phone, fax) to inform them of need for appointment prior to surgery.  Denyce Robert, NP  11/25/2023, 11:26 AM

## 2023-12-11 ENCOUNTER — Ambulatory Visit: Attending: Physician Assistant | Admitting: Physician Assistant

## 2023-12-11 NOTE — Progress Notes (Signed)
 This encounter was created in error - please disregard.

## 2024-04-28 ENCOUNTER — Ambulatory Visit (HOSPITAL_BASED_OUTPATIENT_CLINIC_OR_DEPARTMENT_OTHER)

## 2024-04-28 ENCOUNTER — Encounter (HOSPITAL_BASED_OUTPATIENT_CLINIC_OR_DEPARTMENT_OTHER): Payer: Self-pay

## 2024-04-28 ENCOUNTER — Other Ambulatory Visit (HOSPITAL_BASED_OUTPATIENT_CLINIC_OR_DEPARTMENT_OTHER): Payer: Self-pay

## 2024-04-28 VITALS — BP 144/88 | HR 68 | Ht 67.0 in | Wt 279.0 lb

## 2024-04-28 DIAGNOSIS — G4733 Obstructive sleep apnea (adult) (pediatric): Secondary | ICD-10-CM | POA: Diagnosis not present

## 2024-04-28 DIAGNOSIS — Z87891 Personal history of nicotine dependence: Secondary | ICD-10-CM

## 2024-04-28 DIAGNOSIS — Z6841 Body Mass Index (BMI) 40.0 and over, adult: Secondary | ICD-10-CM

## 2024-04-28 MED ORDER — ZEPBOUND 2.5 MG/0.5ML ~~LOC~~ SOAJ: 2.5000 mg | SUBCUTANEOUS | 0 refills | Status: AC

## 2024-04-28 MED ORDER — ZEPBOUND 5 MG/0.5ML ~~LOC~~ SOAJ: 5.0000 mg | SUBCUTANEOUS | 1 refills | Status: AC

## 2024-04-28 NOTE — Patient Instructions (Addendum)
  Return to clinic in 6-8 weeks for review of compliance download and follow up of Zepbound  start.   Start at 2.5 mg - inject one dose once a week for 4 weeks. After this and if you are doing well, we will increase you every 4 weeks with goal of getting to 10-15 mg once weekly for maintenance  Work on diet measures and exercise - 150 min/week. 30 minutes of strength training 3-5 days a week  We reviewed emergent symptoms to notify of immediately or seek emergency care, including severe nausea/vomiting, inability to pass bowels or gas, severe abdominal pain/tenderness, jaundice, swelling of the face/tongue.  Call if you are having difficulties with any side effects so we can help manage these

## 2024-04-28 NOTE — Assessment & Plan Note (Addendum)
-    Affects all aspects of care -  He has failed diet and exercise plans but continues to keep trying in an effort to decrease his BMI and improve his sleep apnea - Today we discussed the addition of Zepbound  to his regimen for assistance with weight loss and overall improvement in health; Plan as follows: -   Start at 2.5 mg - inject one dose once a week for 4 weeks. After this and if you are doing well, we will increase you every 4 weeks with goal of getting to 10-15 mg once weekly for maintenance  -  Work on diet measures and exercise - 150 min/week. 30 minutes of strength training 3-5 days a week   -  We reviewed emergent symptoms to notify of immediately or seek emergency care, including severe nausea/vomiting, inability to pass bowels or gas, severe abdominal pain/tenderness, jaundice, swelling of the face/tongue.  -  Call if you are having difficulties with any side effects so we can help manage these  Patient has severe sleep apnea related to obesity with BMI >/30 (43.7), posing significant cardiovascular risks. Patient has failed traditional weight loss measures with diet and exercise for >/6 months. Patient will be initiated on Zepbound  (tirzepatide ) for weight management. Zepbound  is the only pharmaceutical treatment approved for moderate-to-severe OSA in adults who are overweight (BMI >/27) or obese (BMI >/30). The patient will continue lifestyle modifications, including structured nutrition and physical activity as directed. No other GLP1 therapy will be used simultaneously at this time. The patient does not have any FDA labeled contraindications to this agent, including pregnancy, lactation, hx or family history of medullary thyroid  cancer, or multiple endocrine neoplasia type II. Side effect profile has been reviewed with patient. Aware of red flag symptoms to notify of immediately or seek emergency care, including severe nausea/vomiting, inability to pass bowels or gas, severe abdominal  pain/tenderness, jaundice.

## 2024-04-28 NOTE — Progress Notes (Signed)
 @Patient  ID: Hector Peters, male    DOB: 09/14/1975, 48 y.o.   MRN: [id number]  Chief Complaint  Patient presents with   Establish Care    New sleep     Referring provider: Chinita Hoy [place], [place]*   HPI:  Hector Peters is a 48 y/o male with PMH of morbid obesity, HTN, GERD, and severe OSA on CPAP who presents as a new patient for evaluation of sleep.  He reports that he was evaluated for sleep apnea back in 2020; sleep study results were reviewed with him today.  This revealed an AHI of 86.4/hr with an O2 sat nadir of 76%.  He was started on AutoPAP, and his last compliance download that we reviewed back in October 2020 demonstrated good compliance with a residual AHI of 0.6/hr.  He reports that he has been faithful in using his machine nightly, and now feels that he could not sleep without it.  He wakes up only once in the night and his wife indicates that he no longer snores.  His machine has been replaced by the manufacturer 3 separate times; his current machine is 48 years old.  He reports that he has continued with diet and exercise in an effort to lower his weight and potentially his need for his CPAP.  Despite continuing to do this for the last 1 to 2 years, he has not had improvement in his BMI.  He is interested in exploring the addition of Zepbound  to his routine to help with healthy weight loss.  He denies any fever, chills, night sweats, weight changes, appetite changes, headaches, waking up more often than once per night.  TEST/EVENTS : 03/03/2019:  Sleep study:  severe OSA with AHI 86.4/hr and O2 sat nadir of 76%  Not on File  Immunization History  Administered Date(s) Administered   Tdap 10/20/2018    Past Medical History:  Diagnosis Date   Diverticulitis 2017   microperforation   Family history of colon cancer in father    GERD (gastroesophageal reflux disease)    Hypertension    Morbid obesity with BMI of 45.0-49.9, adult (HCC)    OSA (obstructive sleep apnea)  02/2019   SEVERE OSA on home sleep study (Dr. Neysa)   Peripheral edema    chlorthalidone for ankle swelling (per past PCP in [place]).   Prediabetes 10/2018   fasting gluc 114, hbA1c 6.2%.  Rpt A1c 03/2019= 6.2%   Tobacco abuse     Tobacco History: Social History   Tobacco Use  Smoking Status Former   Current packs/day: 1.00   Average packs/day: 1 pack/day for 20.0 years (20.0 ttl pk-yrs)   Types: Cigarettes  Smokeless Tobacco Never   Counseling given: Not Answered   Outpatient Medications Prior to Visit  Medication Sig Dispense Refill   Probiotic Product (PROBIOTIC DAILY) CAPS Take by mouth.     valsartan (DIOVAN) 80 MG tablet Take 80 mg by mouth daily.     ketoconazole  (NIZORAL ) 2 % cream Apply 1 Application topically daily. (Patient not taking: Reported on 04/28/2024) 60 g 2   meloxicam (MOBIC) 15 MG tablet Take 7.5 mg by mouth daily. (Patient not taking: Reported on 04/28/2024)     No facility-administered medications prior to visit.     Review of Systems: As per HPI  Constitutional:   No  weight loss, night sweats,  Fevers, chills, fatigue, or  lassitude.  HEENT:   No headaches,  Difficulty swallowing,  Tooth/dental problems, or  Sore throat,  No sneezing, itching, ear ache, nasal congestion, post nasal drip,   CV:  No chest pain,  Orthopnea, PND, swelling in lower extremities, anasarca, dizziness, palpitations, syncope.   GI  No heartburn, indigestion, abdominal pain, nausea, vomiting, diarrhea, change in bowel habits, loss of appetite, bloody stools.   Resp: No shortness of breath with exertion or at rest.  No excess mucus, no productive cough,  No non-productive cough,  No coughing up of blood.  No change in color of mucus.  No wheezing.  No chest wall deformity  Skin: no rash or lesions.  GU: no dysuria, change in color of urine, no urgency or frequency.  No flank pain, no hematuria   MS:  No joint pain or swelling.  No decreased range of motion.  No  back pain.    Physical Exam  BP (!) 144/88   Pulse 68   Ht 5' 7 (1.702 m)   Wt 279 lb (126.6 kg)   BMI 43.70 kg/m   GEN: A/Ox3; pleasant , NAD, well nourished    HEENT:  [place]/AT,  EACs-clear, TMs-wnl, NOSE-clear, THROAT-clear, no lesions, no postnasal drip or exudate noted.  Mallampati 4  NECK:  Supple w/ fair ROM; no JVD; normal carotid impulses w/o bruits; no thyromegaly or nodules palpated; no lymphadenopathy.    RESP  Clear  P & A; w/o, wheezes/ rales/ or rhonchi. no accessory muscle use, no dullness to percussion  CARD:  RRR, no m/r/g, no peripheral edema, pulses intact, no cyanosis or clubbing.  GI:   Obese, soft & nt; nml bowel sounds; no organomegaly or masses detected.   Musco: Warm bil, no deformities or joint swelling noted.   Neuro: alert, no focal deficits noted.    Skin: Warm, no lesions or rashes    Lab Results:  CBC    Component Value Date/Time   WBC 8.0 10/20/2018 0840   RBC 4.47 10/20/2018 0840   HGB 15.2 10/20/2018 0840   HCT 42.9 10/20/2018 0840   PLT 333.0 10/20/2018 0840   MCV 95.9 10/20/2018 0840   MCHC 35.5 10/20/2018 0840   RDW 14.2 10/20/2018 0840   LYMPHSABS 2.6 10/20/2018 0840   MONOABS 0.5 10/20/2018 0840   EOSABS 0.4 10/20/2018 0840   BASOSABS 0.1 10/20/2018 0840    BMET    Component Value Date/Time   NA 138 10/20/2018 0840   K 4.2 10/20/2018 0840   CL 101 10/20/2018 0840   CO2 29 10/20/2018 0840   GLUCOSE 111 (H) 04/01/2019 0849   BUN 19 10/20/2018 0840   CREATININE 1.00 10/20/2018 0840   CALCIUM 9.7 10/20/2018 0840    BNP No results found for: BNP  ProBNP No results found for: PROBNP  Imaging: No results found.  Administration History     None           No data to display          No results found for: NITRICOXIDE   Assessment & Plan:   Assessment & Plan OSA (obstructive sleep apnea) - Patient reports that he derives daily benefit from using the machine and is compliant nightly;  unfortunately his machine is not connected to the app and we cannot access a download -  Plan to have him return to clinic for reevaluation with a compliance download in 6-8 weeks -  Continue current CPAP settings for now Morbid obesity due to excess calories (HCC) -  Affects all aspects of care -  He has failed diet and exercise plans  but continues to keep trying in an effort to decrease his BMI and improve his sleep apnea - Today we discussed the addition of Zepbound  to his regimen for assistance with weight loss and overall improvement in health; Plan as follows: -   Start at 2.5 mg - inject one dose once a week for 4 weeks. After this and if you are doing well, we will increase you every 4 weeks with goal of getting to 10-15 mg once weekly for maintenance  -  Work on diet measures and exercise - 150 min/week. 30 minutes of strength training 3-5 days a week   -  We reviewed emergent symptoms to notify of immediately or seek emergency care, including severe nausea/vomiting, inability to pass bowels or gas, severe abdominal pain/tenderness, jaundice, swelling of the face/tongue.  -  Call if you are having difficulties with any side effects so we can help manage these  Patient has severe sleep apnea related to obesity with BMI >/30 (43.7), posing significant cardiovascular risks. Patient has failed traditional weight loss measures with diet and exercise for >/6 months. Patient will be initiated on Zepbound  (tirzepatide ) for weight management. Zepbound  is the only pharmaceutical treatment approved for moderate-to-severe OSA in adults who are overweight (BMI >/27) or obese (BMI >/30). The patient will continue lifestyle modifications, including structured nutrition and physical activity as directed. No other GLP1 therapy will be used simultaneously at this time. The patient does not have any FDA labeled contraindications to this agent, including pregnancy, lactation, hx or family history of medullary  thyroid  cancer, or multiple endocrine neoplasia type II. Side effect profile has been reviewed with patient. Aware of red flag symptoms to notify of immediately or seek emergency care, including severe nausea/vomiting, inability to pass bowels or gas, severe abdominal pain/tenderness, jaundice.     Return in about 7 weeks (around 06/16/2024) for compliance download.  Candis Dandy, PA-C 04/28/2024

## 2024-04-28 NOTE — Progress Notes (Signed)
 Epworth Sleepiness Scale  Use the following scale to choose the most appropriate number for each situation. 0 Would never nod off 1  Slight  chance of nodding off 2 Moderate chance of nodding off 3 High chance of nodding off  Sitting and reading: 0 Watching TV: 2 Sitting, inactive, in a public place (e.g., in a meeting, theater, or dinner event): 2 As a passenger in a car for an hour or more without stopping for a break: 0 Lying down to rest when circumstances permit:2 Sitting and talking to someone: 0 Sitting quietly after a meal without alcohol: 0 In a car, while stopped for a few  minutes in traffic or at a light: 0  TOTOAL: 6

## 2024-04-28 NOTE — Progress Notes (Signed)
 @Patient  ID: Hector Peters, male    DOB: 10-08-1975, 48 y.o.   MRN: [id number]  No chief complaint on file.   Referring provider: Chinita Hoy [place], [place]*  HPI:   TEST/EVENTS :   No Known Allergies  Immunization History  Administered Date(s) Administered   Tdap 10/20/2018    Past Medical History:  Diagnosis Date   Diverticulitis 2017   microperforation   Family history of colon cancer in father    GERD (gastroesophageal reflux disease)    Hypertension    Morbid obesity with BMI of 45.0-49.9, adult (HCC)    OSA (obstructive sleep apnea) 02/2019   SEVERE OSA on home sleep study (Dr. Neysa)   Peripheral edema    chlorthalidone for ankle swelling (per past PCP in [place]).   Prediabetes 10/2018   fasting gluc 114, hbA1c 6.2%.  Rpt A1c 03/2019= 6.2%   Tobacco abuse     Tobacco History: Social History   Tobacco Use  Smoking Status Former   Current packs/day: 1.00   Average packs/day: 1 pack/day for 20.0 years (20.0 ttl pk-yrs)   Types: Cigarettes  Smokeless Tobacco Never   Counseling given: Not Answered   Outpatient Medications Prior to Visit  Medication Sig Dispense Refill   ketoconazole  (NIZORAL ) 2 % cream Apply 1 Application topically daily. 60 g 2   meloxicam (MOBIC) 15 MG tablet Take 7.5 mg by mouth daily.     Probiotic Product (PROBIOTIC DAILY) CAPS Take by mouth.     valsartan (DIOVAN) 80 MG tablet Take 80 mg by mouth daily.     No facility-administered medications prior to visit.     Review of Systems:   Constitutional:   No  weight loss, night sweats,  Fevers, chills, fatigue, or  lassitude.  HEENT:   No headaches,  Difficulty swallowing,  Tooth/dental problems, or  Sore throat,                No sneezing, itching, ear ache, nasal congestion, post nasal drip,   CV:  No chest pain,  Orthopnea, PND, swelling in lower extremities, anasarca, dizziness, palpitations, syncope.   GI  No heartburn, indigestion, abdominal pain, nausea, vomiting,  diarrhea, change in bowel habits, loss of appetite, bloody stools.   Resp: No shortness of breath with exertion or at rest.  No excess mucus, no productive cough,  No non-productive cough,  No coughing up of blood.  No change in color of mucus.  No wheezing.  No chest wall deformity  Skin: no rash or lesions.  GU: no dysuria, change in color of urine, no urgency or frequency.  No flank pain, no hematuria   MS:  No joint pain or swelling.  No decreased range of motion.  No back pain.    Physical Exam  There were no vitals taken for this visit.  GEN: A/Ox3; pleasant , NAD, well nourished    HEENT:  [place]/AT,  EACs-clear, TMs-wnl, NOSE-clear, THROAT-clear, no lesions, no postnasal drip or exudate noted.   NECK:  Supple w/ fair ROM; no JVD; normal carotid impulses w/o bruits; no thyromegaly or nodules palpated; no lymphadenopathy.    RESP  Clear  P & A; w/o, wheezes/ rales/ or rhonchi. no accessory muscle use, no dullness to percussion  CARD:  RRR, no m/r/g, no peripheral edema, pulses intact, no cyanosis or clubbing.  GI:   Soft & nt; nml bowel sounds; no organomegaly or masses detected.   Musco: Warm bil, no deformities or joint swelling noted.  Neuro: alert, no focal deficits noted.    Skin: Warm, no lesions or rashes    Lab Results:  CBC    Component Value Date/Time   WBC 8.0 10/20/2018 0840   RBC 4.47 10/20/2018 0840   HGB 15.2 10/20/2018 0840   HCT 42.9 10/20/2018 0840   PLT 333.0 10/20/2018 0840   MCV 95.9 10/20/2018 0840   MCHC 35.5 10/20/2018 0840   RDW 14.2 10/20/2018 0840   LYMPHSABS 2.6 10/20/2018 0840   MONOABS 0.5 10/20/2018 0840   EOSABS 0.4 10/20/2018 0840   BASOSABS 0.1 10/20/2018 0840    BMET    Component Value Date/Time   NA 138 10/20/2018 0840   K 4.2 10/20/2018 0840   CL 101 10/20/2018 0840   CO2 29 10/20/2018 0840   GLUCOSE 111 (H) 04/01/2019 0849   BUN 19 10/20/2018 0840   CREATININE 1.00 10/20/2018 0840   CALCIUM 9.7 10/20/2018 0840     BNP No results found for: BNP  ProBNP No results found for: PROBNP  Imaging: No results found.  Administration History     None           No data to display          No results found for: NITRICOXIDE   Assessment & Plan:   Assessment & Plan    No follow-ups on file.  Candis Dandy, PA-C 04/28/2024

## 2024-04-29 NOTE — Telephone Encounter (Signed)
**Note De-identified  [place] Obfuscation** Please advise 

## 2024-07-07 ENCOUNTER — Telehealth (HOSPITAL_BASED_OUTPATIENT_CLINIC_OR_DEPARTMENT_OTHER): Payer: Self-pay | Admitting: *Deleted

## 2024-07-07 ENCOUNTER — Other Ambulatory Visit (HOSPITAL_BASED_OUTPATIENT_CLINIC_OR_DEPARTMENT_OTHER): Payer: Self-pay

## 2024-07-07 ENCOUNTER — Encounter (HOSPITAL_BASED_OUTPATIENT_CLINIC_OR_DEPARTMENT_OTHER): Payer: Self-pay

## 2024-07-07 ENCOUNTER — Other Ambulatory Visit (HOSPITAL_COMMUNITY): Payer: Self-pay

## 2024-07-07 ENCOUNTER — Ambulatory Visit (HOSPITAL_BASED_OUTPATIENT_CLINIC_OR_DEPARTMENT_OTHER)

## 2024-07-07 ENCOUNTER — Telehealth: Payer: Self-pay

## 2024-07-07 VITALS — BP 137/75 | HR 76 | Ht 67.0 in | Wt 283.0 lb

## 2024-07-07 DIAGNOSIS — G4733 Obstructive sleep apnea (adult) (pediatric): Secondary | ICD-10-CM

## 2024-07-07 NOTE — Patient Instructions (Addendum)
 New start Zepbound :  Start at 2.5 mg - inject one dose once a week for 4 weeks. After this and if you are doing well, we will increase you every 4 weeks with goal of getting to 10-15 mg once weekly for maintenance  Work on diet measures and exercise - 150 min/week. 30 minutes of strength training 3-5 days a week  We reviewed emergent symptoms to notify of immediately or seek emergency care, including severe nausea/vomiting, inability to pass bowels or gas, severe abdominal pain/tenderness, jaundice, swelling of the face/tongue.  Call if you are having difficulties with any side effects so we can help manage these     Notify your provider if you are planning to have a procedure/surgery, as this medication will need to be stopped prior.

## 2024-07-07 NOTE — Progress Notes (Signed)
 Order placed for Zepbound - will need PA.

## 2024-07-07 NOTE — Progress Notes (Signed)
 @Patient  ID: Hector Peters, male    DOB: 09-01-1975, 48 y.o.   MRN: [id number]  Chief Complaint  Patient presents with   Sleep Apnea    Referring provider: Chrystal Lamarr RAMAN, *  HPI: Discussed the use of AI scribe software for clinical note transcription with the patient, who gave verbal consent to proceed.  History of Present Illness  Hector Peters is a 48 year old male with obstructive sleep apnea who presents for follow-up regarding CPAP compliance and mask fit issues.  He has been using a CPAP machine for obstructive sleep apnea for almost five years and cannot sleep without it. He is compliant with its use, wearing it for over six and a half to eight hours per night. Recent data shows less than one event per hour on average which is a significant improvement from his initial sleep study which revealed 86.4 events/hr.  He experiences discomfort with the mask, particularly on the bridge of his nose, which he attributes to sleeping on his face. He uses a large mask, as the medium is too small, but finds the large slightly too big, causing it to press against his face. No mask leakage is noted, but the strap sometimes pops off.  He traveled out of the [place] two weeks ago for his son's wedding and took the CPAP machine with him, maintaining compliance. He inquires about the longevity of the machine, noting it is nearly 48 years old.  He reports that he is still interested in Zepbound , and has not received the medication yet.  Last OV 04/28/2024: Hector Peters is a 48 y/o male with PMH of morbid obesity, HTN, GERD, and severe OSA on CPAP who presents as a new patient for evaluation of sleep.  He reports that he was evaluated for sleep apnea back in 2020; sleep study results were reviewed with him today.  This revealed an AHI of 86.4/hr with an O2 sat nadir of 76%.  He was started on AutoPAP, and his last compliance download that we reviewed back in October 2020 demonstrated  good compliance with a residual AHI of 0.6/hr.  He reports that he has been faithful in using his machine nightly, and now feels that he could not sleep without it.  He wakes up only once in the night and his wife indicates that he no longer snores.  His machine has been replaced by the manufacturer 3 separate times; his current machine is 48 years old.  He reports that he has continued with diet and exercise in an effort to lower his weight and potentially his need for his CPAP.  Despite continuing to do this for the last 1 to 2 years, he has not had improvement in his BMI.  He is interested in exploring the addition of Zepbound  to his routine to help with healthy weight loss.  He denies any fever, chills, night sweats, weight changes, appetite changes, headaches, waking up more often than once per night.   TEST/EVENTS : 03/03/2019:  Sleep study:  severe OSA with AHI 86.4/hr and O2 sat nadir of 76%    Immunization History  Administered Date(s) Administered   Tdap 10/20/2018    Past Medical History:  Diagnosis Date   Diverticulitis 2017   microperforation   Family history of colon cancer in father    GERD (gastroesophageal reflux disease)    Hypertension    Morbid obesity with BMI of 45.0-49.9, adult (HCC)    OSA (obstructive sleep apnea) 02/2019   SEVERE OSA  on home sleep study (Dr. Neysa)   Peripheral edema    chlorthalidone for ankle swelling (per past PCP in [place]).   Prediabetes 10/2018   fasting gluc 114, hbA1c 6.2%.  Rpt A1c 03/2019= 6.2%   Tobacco abuse     Tobacco History: Social History   Tobacco Use  Smoking Status Former   Current packs/day: 1.00   Average packs/day: 1 pack/day for 20.0 years (20.0 ttl pk-yrs)   Types: Cigarettes  Smokeless Tobacco Never   Counseling given: Not Answered   Outpatient Medications Prior to Visit  Medication Sig Dispense Refill   Probiotic Product (PROBIOTIC DAILY) CAPS Take by mouth.     tirzepatide  (ZEPBOUND ) 2.5 MG/0.5ML Pen Inject 2.5  mg into the skin once a week. 2 mL 0   tirzepatide  (ZEPBOUND ) 5 MG/0.5ML Pen Inject 5 mg into the skin once a week. 2 mL 1   valsartan (DIOVAN) 80 MG tablet Take 80 mg by mouth daily.     No facility-administered medications prior to visit.     Review of Systems: as per hpi  Constitutional:   No  weight loss, night sweats,  Fevers, chills, fatigue, or  lassitude.  HEENT:   No headaches,  Difficulty swallowing,  Tooth/dental problems, or  Sore throat,                No sneezing, itching, ear ache, nasal congestion, post nasal drip,   CV:  No chest pain,  Orthopnea, PND, swelling in lower extremities, anasarca, dizziness, palpitations, syncope.   GI  No heartburn, indigestion, abdominal pain, nausea, vomiting, diarrhea, change in bowel habits, loss of appetite, bloody stools.   Resp: No shortness of breath with exertion or at rest.  No excess mucus, no productive cough,  No non-productive cough,  No coughing up of blood.  No change in color of mucus.  No wheezing.  No chest wall deformity  Skin: no rash or lesions.  GU: no dysuria, change in color of urine, no urgency or frequency.  No flank pain, no hematuria   MS:  No joint pain or swelling.  No decreased range of motion.  No back pain.    Physical Exam  BP 137/75   Pulse 76   Ht 5' 7 (1.702 m)   Wt 283 lb (128.4 kg)   SpO2 96%   BMI 44.32 kg/m   GEN: A/Ox3; pleasant , NAD, well nourished    HEENT:  /AT,  EACs-clear, TMs-wnl, NOSE-clear, THROAT-clear, no lesions, no postnasal drip or exudate noted. Mallampati 4  NECK:  Supple w/ fair ROM; no JVD; normal carotid impulses w/o bruits; no thyromegaly or nodules palpated; no lymphadenopathy.    RESP  Clear  P & A; w/o, wheezes/ rales/ or rhonchi. no accessory muscle use, no dullness to percussion  CARD:  RRR, no m/r/g, no peripheral edema, pulses intact, no cyanosis or clubbing.  GI:   obese, soft & nt; nml bowel sounds; no organomegaly or masses detected.   Musco:  Warm bil, no deformities or joint swelling noted.   Neuro: alert, no focal deficits noted.    Skin: Warm, no lesions or rashes    Lab Results:  CBC    Component Value Date/Time   WBC 8.0 10/20/2018 0840   RBC 4.47 10/20/2018 0840   HGB 15.2 10/20/2018 0840   HCT 42.9 10/20/2018 0840   PLT 333.0 10/20/2018 0840   MCV 95.9 10/20/2018 0840   MCHC 35.5 10/20/2018 0840   RDW 14.2 10/20/2018 0840  LYMPHSABS 2.6 10/20/2018 0840   MONOABS 0.5 10/20/2018 0840   EOSABS 0.4 10/20/2018 0840   BASOSABS 0.1 10/20/2018 0840    BMET    Component Value Date/Time   NA 138 10/20/2018 0840   K 4.2 10/20/2018 0840   CL 101 10/20/2018 0840   CO2 29 10/20/2018 0840   GLUCOSE 111 (H) 04/01/2019 0849   BUN 19 10/20/2018 0840   CREATININE 1.00 10/20/2018 0840   CALCIUM 9.7 10/20/2018 0840    BNP No results found for: BNP  ProBNP No results found for: PROBNP  Imaging: No results found.  Administration History     None           No data to display          No results found for: NITRICOXIDE   Assessment & Plan:   Assessment & Plan OSA (obstructive sleep apnea)  Assessment and Plan Assessment & Plan Obstructive sleep apnea Well-managed with CPAP therapy. Excellent compliance with reduced events. Occasional mask discomfort noted. - Continue CPAP therapy with a goal of at least four to six hours per night. - Monitor mask fit and adjust as necessary to ensure comfort and prevent leakage. - Consider replacement of CPAP machine as it approaches the end of its typical lifespan.  Morbid obesity with BMI 45.0-49.9 Interested in Zepbound  for weight management. Aware of potential gastrointestinal side effects and titration process. Informational packet attached to AVS. - Contacted pharmacy to resolve dispensing issues with Zepbound . - Schedule follow-up in three to four months to monitor progress and adjust treatment as necessary.  Patient has severe sleep apnea  related to obesity with BMI >/30 (44.32) posing significant cardiovascular risks. Patient has failed traditional weight loss measures with caloric deficit and consistent exercise of 150 min/week for >/6 months. Patient will be initiated on Zepbound  (tirzepatide ) for weight management. Zepbound  is the only pharmaceutical treatment approved for moderate-to-severe OSA in adults who are overweight (BMI >/27) or obese (BMI >/30). The patient will continue lifestyle modifications, including structured nutrition and physical activity as directed. No other GLP1 therapy will be used simultaneously at this time. The patient does not have any FDA labeled contraindications to this agent, including pregnancy, lactation, hx or family history of medullary thyroid  cancer, or multiple endocrine neoplasia type II. Side effect profile has been reviewed with patient. Aware of red flag symptoms to notify of immediately or seek emergency care, including severe nausea/vomiting, inability to pass bowels or gas, severe abdominal pain/tenderness, jaundice.      Candis Dandy, PA-C 07/07/2024

## 2024-07-07 NOTE — Telephone Encounter (Signed)
Pharma

## 2024-07-07 NOTE — Telephone Encounter (Signed)
 I let the patient know that it does not appear to be covered.  Do we have any other options at this point?

## 2024-07-07 NOTE — Telephone Encounter (Signed)
*  Pulm  This medication may be excluded from the patient's benefit. For more information, please reach out to [facility] directly at [phone].

## 2024-07-29 NOTE — Telephone Encounter (Signed)
[facility]

## 2024-08-31 NOTE — Telephone Encounter (Signed)
"  Not approved    "
